# Patient Record
Sex: Female | Born: 1995 | Race: White | Hispanic: No | Marital: Married | Smoking: Never smoker
Health system: Southern US, Community
[De-identification: ages and names within clinical notes are randomized; demographics above are authoritative.]

## PROBLEM LIST (undated history)

## (undated) ENCOUNTER — Inpatient Hospital Stay (HOSPITAL_COMMUNITY): Payer: Self-pay

## (undated) DIAGNOSIS — F419 Anxiety disorder, unspecified: Secondary | ICD-10-CM

## (undated) DIAGNOSIS — Z3A37 37 weeks gestation of pregnancy: Secondary | ICD-10-CM

## (undated) DIAGNOSIS — D649 Anemia, unspecified: Secondary | ICD-10-CM

## (undated) DIAGNOSIS — N390 Urinary tract infection, site not specified: Secondary | ICD-10-CM

## (undated) HISTORY — PX: ANTERIOR CRUCIATE LIGAMENT REPAIR: SHX115

## (undated) HISTORY — DX: 37 weeks gestation of pregnancy: Z3A.37

## (undated) HISTORY — PX: CYSTOSTOMY W/ BLADDER DILATION: SHX1432

## (undated) HISTORY — PX: WISDOM TOOTH EXTRACTION: SHX21

## (undated) HISTORY — DX: Urinary tract infection, site not specified: N39.0

---
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
## 2013-07-13 ENCOUNTER — Telehealth: Payer: Self-pay | Admitting: Nurse Practitioner

## 2013-07-13 NOTE — Telephone Encounter (Signed)
appt given for wed 8/13 with [facility]

## 2013-07-18 ENCOUNTER — Encounter: Payer: Self-pay | Admitting: General Practice

## 2013-07-18 ENCOUNTER — Ambulatory Visit (INDEPENDENT_AMBULATORY_CARE_PROVIDER_SITE_OTHER): Payer: Federal, State, Local not specified - PPO | Admitting: General Practice

## 2013-07-18 VITALS — BP 124/72 | HR 83 | Temp 98.1°F | Ht 68.0 in | Wt 153.0 lb

## 2013-07-18 DIAGNOSIS — Z0000 Encounter for general adult medical examination without abnormal findings: Secondary | ICD-10-CM

## 2013-07-18 NOTE — Progress Notes (Signed)
  Subjective:    Patient ID: Kristen Patterson, female    DOB: [birth year], 17 y.o.   MRN: [id number]  HPI Patient presents today for annnual physical. She reports attending [employer] and participating in volleyball. She denies any concerns or problems at this time. She reports last menstrual cycle was 3 weeks ago.    Review of Systems  Unable to perform ROS Constitutional: Negative for fever and chills.  HENT: Negative for neck pain and neck stiffness.   Respiratory: Negative for chest tightness and shortness of breath.   Cardiovascular: Negative for chest pain and palpitations.  Genitourinary: Negative for dysuria and difficulty urinating.  Neurological: Negative for dizziness, weakness and headaches.       Objective:   Physical Exam  Constitutional: She is oriented to person, place, and time. She appears well-developed and well-nourished.  HENT:  Head: Normocephalic and atraumatic.  Right Ear: External ear normal.  Left Ear: External ear normal.  Nose: Nose normal.  Mouth/Throat: Oropharynx is clear and moist.  Eyes: EOM are normal. Pupils are equal, round, and reactive to light.  Neck: Normal range of motion. Neck supple. No thyromegaly present.  Cardiovascular: Normal rate, regular rhythm and normal heart sounds.   Pulmonary/Chest: Effort normal and breath sounds normal. No respiratory distress. She exhibits no tenderness.  Abdominal: Soft. Bowel sounds are normal. She exhibits no distension. There is no tenderness.  Musculoskeletal: She exhibits no edema and no tenderness.  Lymphadenopathy:    She has no cervical adenopathy.  Neurological: She is alert and oriented to person, place, and time.  Skin: Skin is warm and dry.  Psychiatric: She has a normal mood and affect.          Assessment & Plan:  1. Annual physical exam -discussed eating healthy eating and regular exercise -RTO if symptoms develop  -Patient verbalized understanding -Coralie Keens, FNP-C

## 2014-04-08 IMAGING — US US GUIDANCE NEEDLE PLACEMENT
1 series · 8 of 8 positions shown · non-contrast
Comparison: None.

CLINICAL DATA: Fluid collection around scapula.

IR ULTRASOUND GUIDED ASPIRATION/DRAINAGE

[Series 1: us guidance needle placement · 0.13mm/px · 8 of 8 slices shown]
[im 1/8]
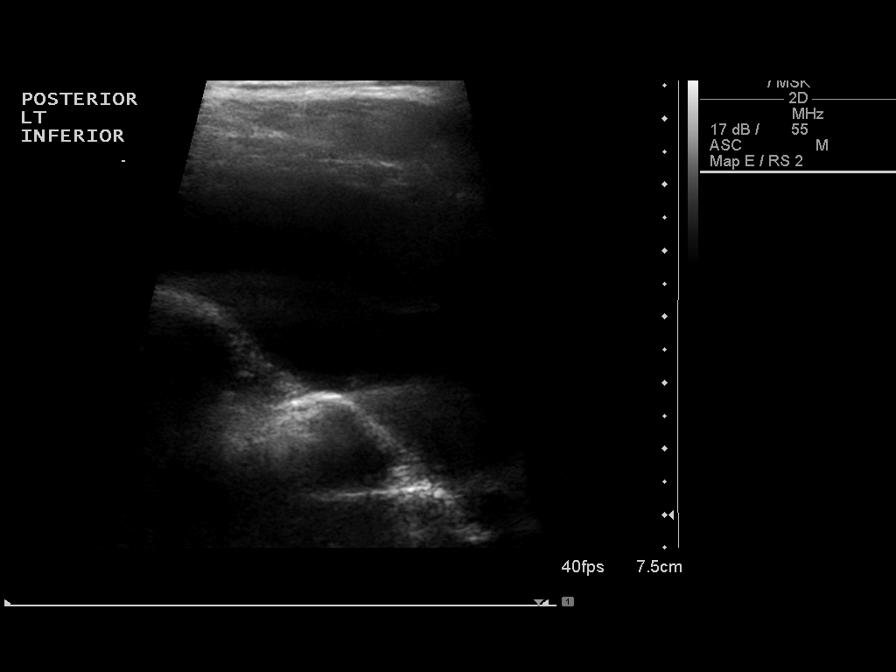
[im 2/8]
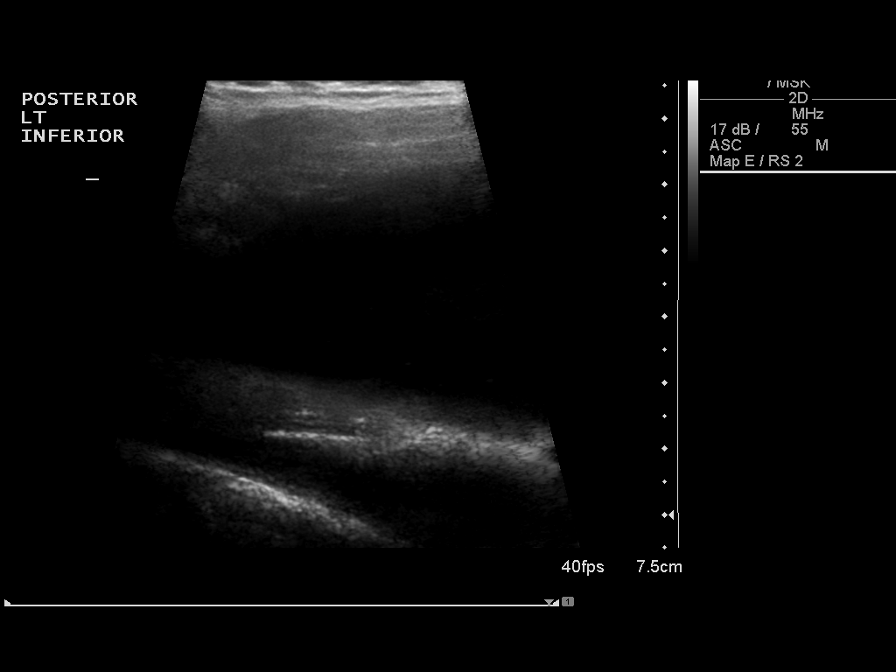
[im 3/8]
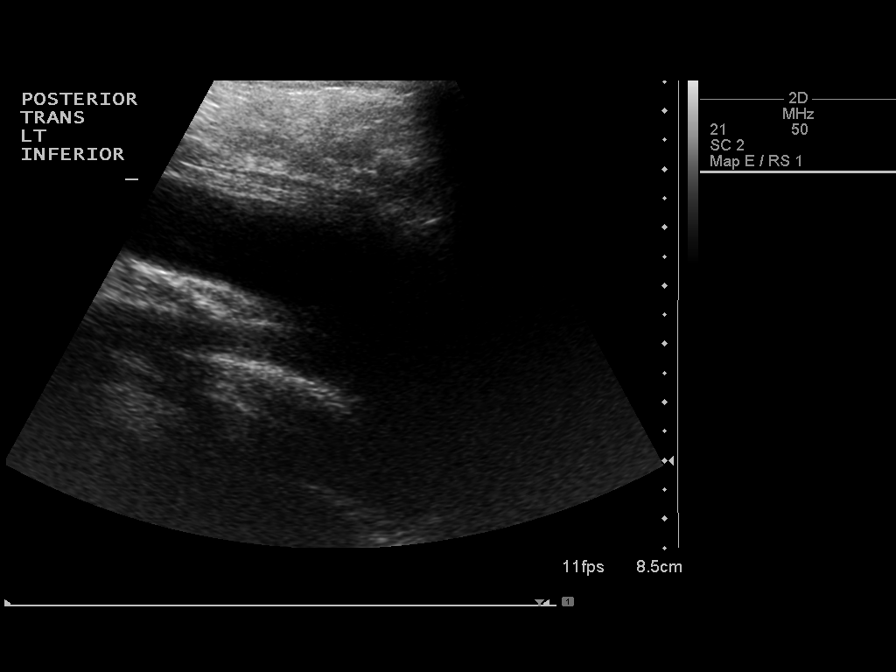
[im 4/8]
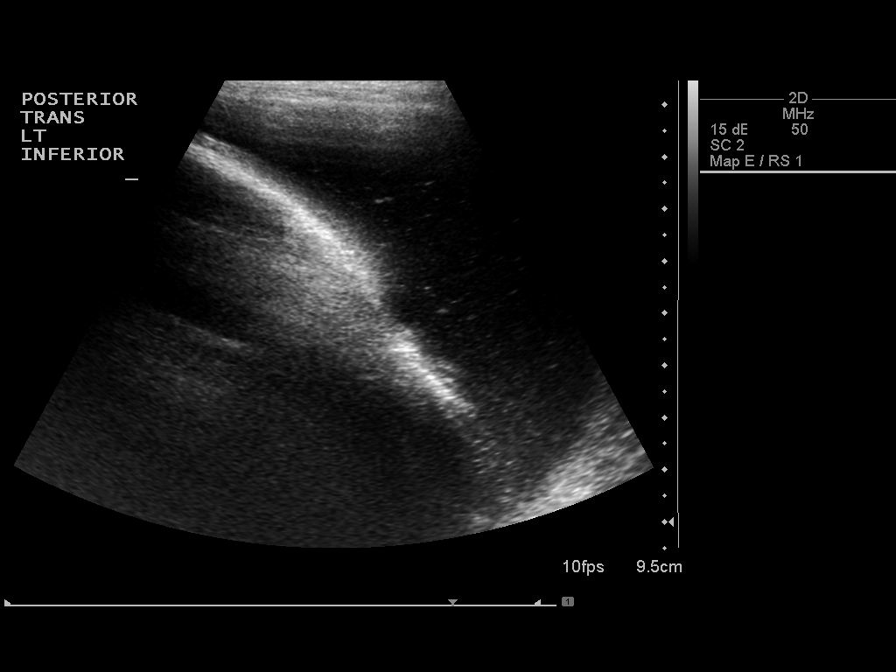
[im 5/8]
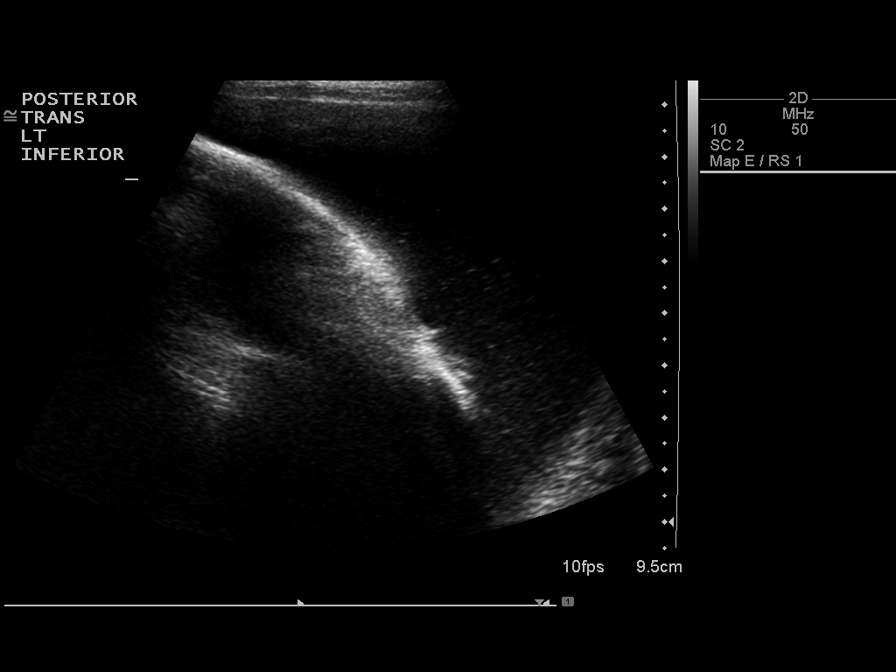
[im 6/8]
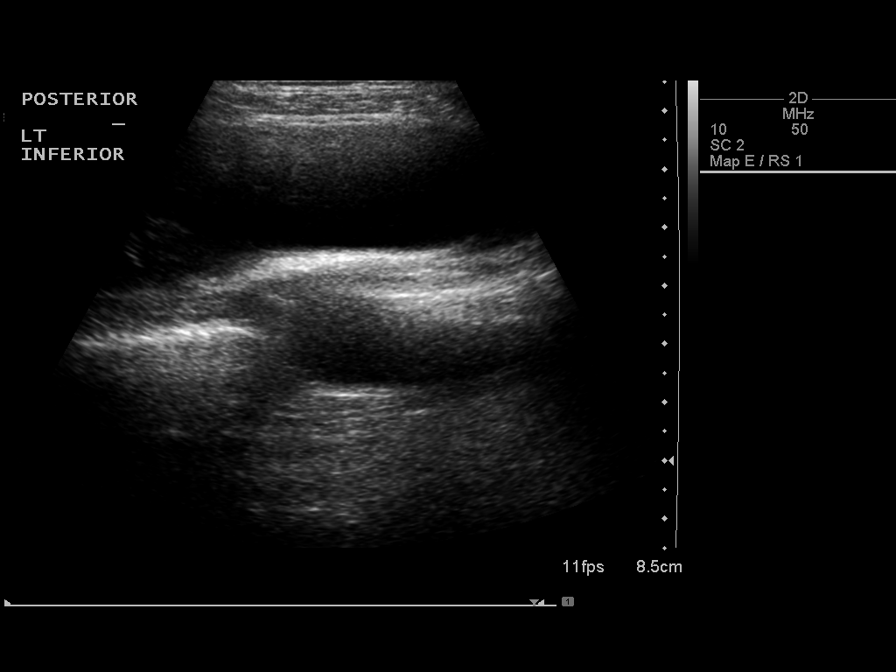
[im 7/8]
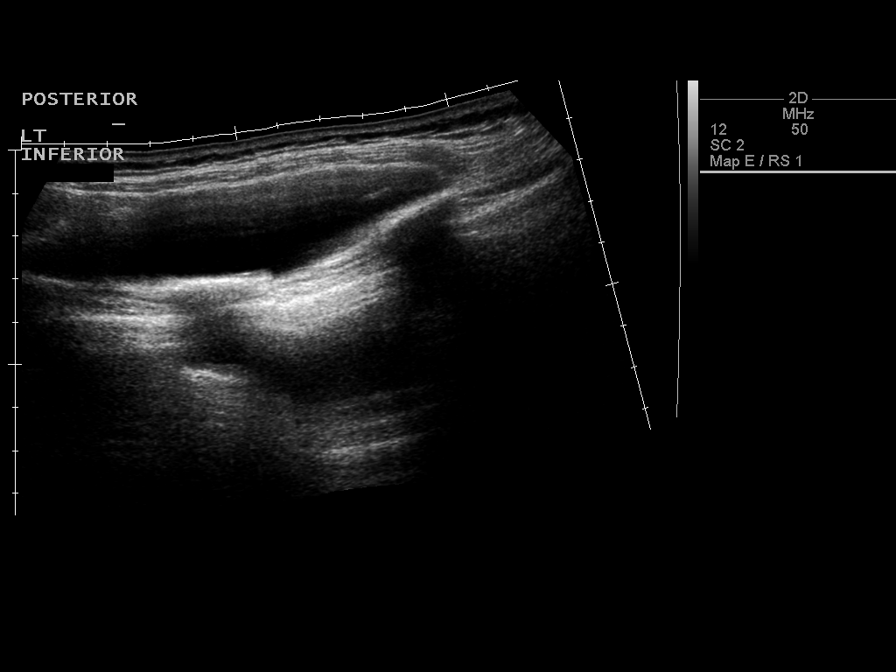
[im 8/8]
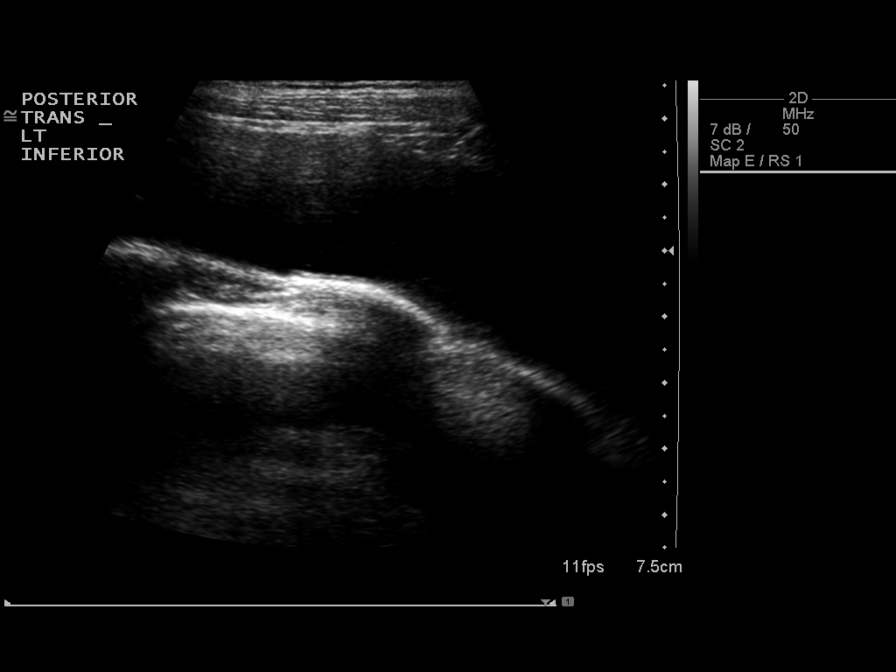

[8 of 8 positions shown; findings below may reference images not displayed]

FINDINGS: Preliminary ultrasound scanning demonstrated a fluid
collection interposed between the superficial back musculature and
the thoracic cage in the left upper back.  This had internal echoes
with septations and was consistent with liquefied hematoma.  No
internal vascular flow is identified.

After thorough discussion of risks and benefits of the procedure,
written and verbal informed consent was obtained by Dr. Xian.
General risks of the procedure included bleeding, infection, injury
to nerves, blood vessels, and adjacent structures.  Specific risks
included pneumothorax.

The skin was prepped and draped in the usual sterile fashion.
Under stringent sterile technique, a 20 gauge needle was advanced
under ultrasound guidance after local anesthesia with 1% lidocaine
without epinephrine.  Using ultrasound guidance, the fluid pockets
were anterior and 40 ml of bloody serous fluid was aspirated.  The
pocket almost completely collapsed.  There was rupture of the more
lateral superior pocket on entry.

The patient tolerated the procedure well.  There are no
complications. Postprocedural scanning demonstrate collapse of the
pockets.
IMPRESSION: Successful ultrasound guided aspiration of left upper back
liquefied hematoma.

## 2014-12-04 LAB — OB RESULTS CONSOLE ABO/RH: RH Type: POSITIVE

## 2014-12-04 LAB — OB RESULTS CONSOLE RUBELLA ANTIBODY, IGM: Rubella: IMMUNE

## 2014-12-04 LAB — OB RESULTS CONSOLE GC/CHLAMYDIA
Chlamydia: NEGATIVE
Gonorrhea: NEGATIVE

## 2014-12-04 LAB — OB RESULTS CONSOLE ANTIBODY SCREEN: Antibody Screen: NEGATIVE

## 2014-12-04 LAB — OB RESULTS CONSOLE HEPATITIS B SURFACE ANTIGEN: HEP B S AG: NEGATIVE

## 2014-12-04 LAB — OB RESULTS CONSOLE HIV ANTIBODY (ROUTINE TESTING): HIV: NONREACTIVE

## 2014-12-04 LAB — OB RESULTS CONSOLE RPR: RPR: NONREACTIVE

## 2014-12-06 NOTE — L&D Delivery Note (Addendum)
Delivery Note   Kristen Patterson, Kristen Patterson [[id number]][[id number]]  At 11:28 AM a viable female was delivered via Vaginal, Vacuum (Extractor) (Presentation: Left Occiput Anterior).  APGAR: 4, 4; weight  Pending.  Outlet vacuum with Kiwi due to severe maternal back pain.   Placenta status: Intact, Spontaneous Pathology.  Cord: 3 vessels with the following complications: None.     Kristen Patterson, Kristen Patterson [[id number]][[id number]]  At 12:12 PM a viable female was delivered via  (Presentation: ;  ).  APGAR: 1, 8; weight  pending.   Placenta status: spontaneous, intact.  Cord: 3 vessels with the following complications: none.  Baby B was breech, I attempted external cephalic version without success.  By u/s and palpation the baby was in frank breech presentation so I could not do a breech extraction.  I did AROM with clear fluid, breech came down nicely and she pushed the baby out without difficulty with routine maneuvers for a breech delivery.  Anesthesia: Epidural and local Episiotomy:  None Lacerations:  2nd degree and left labial Suture Repair: 2.0 vicryl and 3-0 Vicryl Rapide Est. Blood Loss (mL): 500   Mom to postpartum.   Baby A to NICU.   Baby B to Couplet care / Skin to Skin.  Julian Medina D 06/14/2015, 12:55 PM

## 2015-06-10 ENCOUNTER — Telehealth (HOSPITAL_COMMUNITY): Payer: Self-pay | Admitting: *Deleted

## 2015-06-10 ENCOUNTER — Encounter (HOSPITAL_COMMUNITY): Payer: Self-pay | Admitting: *Deleted

## 2015-06-10 LAB — OB RESULTS CONSOLE GBS: STREP GROUP B AG: POSITIVE

## 2015-06-10 NOTE — Telephone Encounter (Signed)
Preadmission screen  

## 2015-06-13 ENCOUNTER — Inpatient Hospital Stay (HOSPITAL_COMMUNITY)
Admission: RE | Admit: 2015-06-13 | Discharge: 2015-06-16 | DRG: 775 | Disposition: A | Discharge status: Home / Self Care | Payer: Federal, State, Local not specified - PPO | Source: Ambulatory Visit | Attending: Obstetrics and Gynecology | Admitting: Obstetrics and Gynecology

## 2015-06-13 ENCOUNTER — Inpatient Hospital Stay (HOSPITAL_COMMUNITY): Payer: Federal, State, Local not specified - PPO | Admitting: Anesthesiology

## 2015-06-13 ENCOUNTER — Encounter (HOSPITAL_COMMUNITY): Payer: Self-pay

## 2015-06-13 ENCOUNTER — Inpatient Hospital Stay (HOSPITAL_COMMUNITY): Payer: Federal, State, Local not specified - PPO

## 2015-06-13 DIAGNOSIS — O30003 Twin pregnancy, unspecified number of placenta and unspecified number of amniotic sacs, third trimester: Secondary | ICD-10-CM | POA: Diagnosis present

## 2015-06-13 DIAGNOSIS — O99824 Streptococcus B carrier state complicating childbirth: Secondary | ICD-10-CM | POA: Diagnosis present

## 2015-06-13 DIAGNOSIS — O30043 Twin pregnancy, dichorionic/diamniotic, third trimester: Secondary | ICD-10-CM | POA: Diagnosis present

## 2015-06-13 DIAGNOSIS — Z3A37 37 weeks gestation of pregnancy: Secondary | ICD-10-CM | POA: Diagnosis present

## 2015-06-13 DIAGNOSIS — O133 Gestational [pregnancy-induced] hypertension without significant proteinuria, third trimester: Secondary | ICD-10-CM | POA: Diagnosis present

## 2015-06-13 DIAGNOSIS — Z3689 Encounter for other specified antenatal screening: Secondary | ICD-10-CM | POA: Diagnosis not present

## 2015-06-13 DIAGNOSIS — O321XX2 Maternal care for breech presentation, fetus 2: Secondary | ICD-10-CM | POA: Diagnosis present

## 2015-06-13 DIAGNOSIS — O30009 Twin pregnancy, unspecified number of placenta and unspecified number of amniotic sacs, unspecified trimester: Secondary | ICD-10-CM | POA: Diagnosis not present

## 2015-06-13 HISTORY — DX: Anemia, unspecified: D64.9

## 2015-06-13 LAB — CBC
HCT: 33.7 % — ABNORMAL LOW (ref 36.0–46.0)
HEMOGLOBIN: 11.9 g/dL — AB (ref 12.0–15.0)
MCH: 33.7 pg (ref 26.0–34.0)
MCHC: 35.3 g/dL (ref 30.0–36.0)
MCV: 95.5 fL (ref 78.0–100.0)
Platelets: 149 10*3/uL — ABNORMAL LOW (ref 150–400)
RBC: 3.53 MIL/uL — ABNORMAL LOW (ref 3.87–5.11)
RDW: 13.3 % (ref 11.5–15.5)
WBC: 8 10*3/uL (ref 4.0–10.5)

## 2015-06-13 LAB — COMPREHENSIVE METABOLIC PANEL
ALT: 11 U/L — ABNORMAL LOW (ref 14–54)
ANION GAP: 6 (ref 5–15)
AST: 21 U/L (ref 15–41)
Albumin: 2.7 g/dL — ABNORMAL LOW (ref 3.5–5.0)
Alkaline Phosphatase: 231 U/L — ABNORMAL HIGH (ref 38–126)
BUN: 7 mg/dL (ref 6–20)
CHLORIDE: 108 mmol/L (ref 101–111)
CO2: 18 mmol/L — ABNORMAL LOW (ref 22–32)
CREATININE: 0.49 mg/dL (ref 0.44–1.00)
Calcium: 8.4 mg/dL — ABNORMAL LOW (ref 8.9–10.3)
GFR calc Af Amer: >60 mL/min (ref >60)
GFR calc non Af Amer: >60 mL/min (ref >60)
GLUCOSE: 83 mg/dL (ref 65–99)
Potassium: 3.9 mmol/L (ref 3.5–5.1)
Sodium: 132 mmol/L — ABNORMAL LOW (ref 135–145)
Total Bilirubin: 0.6 mg/dL (ref 0.3–1.2)
Total Protein: 5.6 g/dL — ABNORMAL LOW (ref 6.5–8.1)

## 2015-06-13 LAB — RPR: RPR Ser Ql: NONREACTIVE

## 2015-06-13 MED ORDER — DIPHENHYDRAMINE HCL 50 MG/ML IJ SOLN: 12.5000 mg | INTRAMUSCULAR | Status: DC | PRN

## 2015-06-13 MED ORDER — BUTORPHANOL TARTRATE 1 MG/ML IJ SOLN: 1.0000 mg | INTRAMUSCULAR | Status: DC | PRN

## 2015-06-13 MED ORDER — LIDOCAINE HCL (PF) 1 % IJ SOLN
INTRAMUSCULAR | Status: DC | PRN
  Administered 2015-06-13 (×2): 9 mL

## 2015-06-13 MED ORDER — CITRIC ACID-SODIUM CITRATE 334-500 MG/5ML PO SOLN
30.0000 mL | ORAL | Status: DC | PRN
  Administered 2015-06-13 – 2015-06-14 (×2): 30 mL via ORAL
  Filled 2015-06-13 (×2): qty 15

## 2015-06-13 MED ORDER — PHENYLEPHRINE 40 MCG/ML (10ML) SYRINGE FOR IV PUSH (FOR BLOOD PRESSURE SUPPORT)
80.0000 ug | PREFILLED_SYRINGE | INTRAVENOUS | Status: DC | PRN
  Filled 2015-06-13 (×2): qty 20
  Filled 2015-06-13: qty 2

## 2015-06-13 MED ORDER — OXYCODONE-ACETAMINOPHEN 5-325 MG PO TABS: 2.0000 | ORAL_TABLET | ORAL | Status: DC | PRN

## 2015-06-13 MED ORDER — OXYCODONE-ACETAMINOPHEN 5-325 MG PO TABS: 1.0000 | ORAL_TABLET | ORAL | Status: DC | PRN

## 2015-06-13 MED ORDER — OXYTOCIN 40 UNITS IN LACTATED RINGERS INFUSION - SIMPLE MED
1.0000 m[IU]/min | INTRAVENOUS | Status: DC
  Administered 2015-06-13: 2 m[IU]/min via INTRAVENOUS
  Administered 2015-06-13: 4 m[IU]/min via INTRAVENOUS
  Filled 2015-06-13: qty 1000

## 2015-06-13 MED ORDER — OXYTOCIN 40 UNITS IN LACTATED RINGERS INFUSION - SIMPLE MED: 62.5000 mL/h | INTRAVENOUS | Status: DC

## 2015-06-13 MED ORDER — LACTATED RINGERS IV SOLN
INTRAVENOUS | Status: DC
  Administered 2015-06-13 – 2015-06-14 (×4): via INTRAVENOUS

## 2015-06-13 MED ORDER — OXYTOCIN BOLUS FROM INFUSION
500.0000 mL | INTRAVENOUS | Status: DC
  Administered 2015-06-14: 500 mL via INTRAVENOUS

## 2015-06-13 MED ORDER — TERBUTALINE SULFATE 1 MG/ML IJ SOLN: 0.2500 mg | Freq: Once | INTRAMUSCULAR | Status: AC | PRN

## 2015-06-13 MED ORDER — DEXTROSE 5 % IV SOLN
2.5000 10*6.[IU] | INTRAVENOUS | Status: DC
  Administered 2015-06-13 – 2015-06-14 (×6): 2.5 10*6.[IU] via INTRAVENOUS
  Filled 2015-06-13 (×11): qty 2.5

## 2015-06-13 MED ORDER — FENTANYL 2.5 MCG/ML BUPIVACAINE 1/10 % EPIDURAL INFUSION (WH - ANES)
14.0000 mL/h | INTRAMUSCULAR | Status: DC | PRN
  Administered 2015-06-13 – 2015-06-14 (×4): 14 mL/h via EPIDURAL
  Filled 2015-06-13 (×5): qty 125

## 2015-06-13 MED ORDER — LIDOCAINE HCL (PF) 1 % IJ SOLN
30.0000 mL | INTRAMUSCULAR | Status: DC | PRN
  Filled 2015-06-13: qty 30

## 2015-06-13 MED ORDER — PENICILLIN G POTASSIUM 5000000 UNITS IJ SOLR
5.0000 10*6.[IU] | Freq: Once | INTRAVENOUS | Status: AC
  Administered 2015-06-13: 5 10*6.[IU] via INTRAVENOUS
  Filled 2015-06-13: qty 5

## 2015-06-13 MED ORDER — EPHEDRINE 5 MG/ML INJ: 10.0000 mg | INTRAVENOUS | Status: DC | PRN

## 2015-06-13 MED ORDER — ONDANSETRON HCL 4 MG/2ML IJ SOLN: 4.0000 mg | Freq: Four times a day (QID) | INTRAMUSCULAR | Status: DC | PRN

## 2015-06-13 MED ORDER — LACTATED RINGERS IV SOLN
500.0000 mL | INTRAVENOUS | Status: DC | PRN
  Administered 2015-06-13: 1000 mL via INTRAVENOUS

## 2015-06-13 NOTE — Progress Notes (Signed)
Comfortable, some back pain Afeb, VSS, BP stable FHT- Cat I x 2, ctx q 2-3 min VE-4/90/-1, vtx Continue pitocin and PCN, monitor progress, anticipate SVD

## 2015-06-13 NOTE — Progress Notes (Signed)
Comfortable with epidural Afeb, VSS, BP 140-150/70-90 FHT- Cat I x 2 VE-3/80/-1, vtx Continue pitocin, monitor progress.  Will dleiver in OR since Baby B is breech, will try version or breech extraction.

## 2015-06-13 NOTE — Anesthesia Preprocedure Evaluation (Addendum)
Anesthesia Evaluation  Patient identified by MRN, date of birth, ID band Patient awake    Reviewed: Allergy & Precautions, H&P , NPO status , Patient's Chart, lab work & pertinent test results  Airway Mallampati: II  TM Distance: >3 FB Neck ROM: full    Dental no notable dental hx.    Pulmonary neg pulmonary ROS,    Pulmonary exam normal       Cardiovascular negative cardio ROS Normal cardiovascular exam    Neuro/Psych negative neurological ROS  negative psych ROS   GI/Hepatic negative GI ROS, Neg liver ROS,   Endo/Other  negative endocrine ROS  Renal/GU negative Renal ROS     Musculoskeletal   Abdominal (+) + obese,   Peds  Hematology negative hematology ROS (+)   Anesthesia Other Findings   Reproductive/Obstetrics (+) Pregnancy                             Anesthesia Physical Anesthesia Plan  ASA: II  Anesthesia Plan: Epidural   Post-op Pain Management:    Induction:   Airway Management Planned:   Additional Equipment:   Intra-op Plan:   Post-operative Plan:   Informed Consent: I have reviewed the patients History and Physical, chart, labs and discussed the procedure including the risks, benefits and alternatives for the proposed anesthesia with the patient or authorized representative who has indicated his/her understanding and acceptance.     Plan Discussed with:   Anesthesia Plan Comments: (To OR for vaginal delivery of twins with existing epidural.)       Anesthesia Quick Evaluation

## 2015-06-13 NOTE — H&P (Signed)
Kristen Patterson is a 19 y.o. female, G1P0, EGA 37+ weeks with EDC 7-28, di/di twins presenting for induction with favorable cervix and PIH.  Prenatal care complicated by di/di twins, most recently vtx/vtx, with concordant growth, and mild hypertension and borderline proteinuria, BP controlled with low dose Labetalol.  Maternal Medical History:  Fetal activity: Perceived fetal activity is normal.    Prenatal complications: PIH.     OB History    Gravida Para Term Preterm AB TAB SAB Ectopic Multiple Living   1              Past Medical History  Diagnosis Date  . Chronic UTI     as child   Past Surgical History  Procedure Laterality Date  . Anterior cruciate ligament repair    . Wisdom tooth extraction    . Cystostomy w/ bladder dilation     Family History: family history is not on file. Social History:  reports that she has never smoked. She has never used smokeless tobacco. She reports that she does not drink alcohol or use illicit drugs.   Prenatal Transfer Tool  Maternal Diabetes: No Genetic Screening: Declined Maternal Ultrasounds/Referrals: Normal Fetal Ultrasounds or other Referrals:  None Maternal Substance Abuse:  No Significant Maternal Medications:  None Significant Maternal Lab Results:  Lab values include: Group B Strep positive Other Comments:  di/di twins, PIH  Review of Systems  Respiratory: Negative.   Cardiovascular: Negative.       Last menstrual period 09/26/2014. Maternal Exam:  Abdomen: Patient reports no abdominal tenderness. Fetal presentation: vertex  Introitus: Normal vulva. Normal vagina.  Amniotic fluid character: not assessed.  Pelvis: adequate for delivery.   Cervix: Cervix evaluated by digital exam.    VE-2-3/70/-2, vtx Physical Exam  Constitutional: She appears well-developed and well-nourished.  Cardiovascular: Normal rate, regular rhythm and normal heart sounds.   No murmur heard. Respiratory: Effort normal and breath sounds  normal. No respiratory distress. She has no wheezes.  GI: Soft.    Prenatal labs: ABO, Rh: A/Positive/-- (12/30 0000) Antibody: Negative (12/30 0000) Rubella: Immune (12/30 0000) RPR: Nonreactive (12/30 0000)  HBsAg: Negative (12/30 0000)  HIV: Non-reactive (12/30 0000)  GBS: Positive (07/05 0000)   Assessment/Plan: Di/di twin pregnancy at 37 weeks with PIH and favorable cervix for induction.  Will start pitocin for induction, PCN for +GBS.  Will get u/s to confirm presentation of Baby B just to help direct delivery.   Kristen Patterson D 06/13/2015, 8:20 AM

## 2015-06-13 NOTE — Anesthesia Procedure Notes (Signed)
Epidural Patient location during procedure: OB Start time: 06/13/2015 1:04 PM End time: 06/13/2015 1:08 PM  Staffing Anesthesiologist: Leilani AbleHATCHETT, Donielle Radziewicz Performed by: anesthesiologist   Preanesthetic Checklist Completed: patient identified, surgical consent, pre-op evaluation, timeout performed, IV checked, risks and benefits discussed and monitors and equipment checked  Epidural Patient position: sitting Prep: site prepped and draped and DuraPrep Patient monitoring: continuous pulse ox and blood pressure Approach: midline Location: L3-L4 Injection technique: LOR air  Needle:  Needle type: Tuohy  Needle gauge: 17 G Needle length: 9 cm and 9 Needle insertion depth: 5 cm cm Catheter type: closed end flexible Catheter size: 19 Gauge Catheter at skin depth: 10 cm Test dose: negative and Other  Assessment Sensory level: T9 Events: blood not aspirated, injection not painful, no injection resistance, negative IV test and no paresthesia  Additional Notes Reason for block:procedure for pain

## 2015-06-14 ENCOUNTER — Encounter (HOSPITAL_COMMUNITY)
Admission: RE | Disposition: A | Discharge status: Home / Self Care | Payer: Self-pay | Source: Ambulatory Visit | Attending: Obstetrics and Gynecology

## 2015-06-14 ENCOUNTER — Encounter (HOSPITAL_COMMUNITY): Payer: Self-pay

## 2015-06-14 DIAGNOSIS — O30009 Twin pregnancy, unspecified number of placenta and unspecified number of amniotic sacs, unspecified trimester: Secondary | ICD-10-CM

## 2015-06-14 HISTORY — DX: Twin pregnancy, unspecified number of placenta and unspecified number of amniotic sacs, unspecified trimester: O30.009

## 2015-06-14 LAB — CBC
HEMATOCRIT: 34.4 % — AB (ref 36.0–46.0)
Hemoglobin: 12.1 g/dL (ref 12.0–15.0)
MCH: 33.6 pg (ref 26.0–34.0)
MCHC: 35.2 g/dL (ref 30.0–36.0)
MCV: 95.6 fL (ref 78.0–100.0)
Platelets: 117 10*3/uL — ABNORMAL LOW (ref 150–400)
RBC: 3.6 MIL/uL — ABNORMAL LOW (ref 3.87–5.11)
RDW: 13.2 % (ref 11.5–15.5)
WBC: 16.6 10*3/uL — ABNORMAL HIGH (ref 4.0–10.5)

## 2015-06-14 LAB — TYPE AND SCREEN
ABO/RH(D): A POS
ANTIBODY SCREEN: NEGATIVE

## 2015-06-14 LAB — ABO/RH: ABO/RH(D): A POS

## 2015-06-14 SURGERY — Surgical Case
Anesthesia: Epidural

## 2015-06-14 MED ORDER — DIPHENHYDRAMINE HCL 25 MG PO CAPS: 25.0000 mg | ORAL_CAPSULE | Freq: Four times a day (QID) | ORAL | Status: DC | PRN

## 2015-06-14 MED ORDER — LIDOCAINE-EPINEPHRINE (PF) 2 %-1:200000 IJ SOLN
INTRAMUSCULAR | Status: DC | PRN
  Administered 2015-06-14: 4 mL via EPIDURAL
  Administered 2015-06-14: 1 mL via EPIDURAL
  Administered 2015-06-14: 2 mL via EPIDURAL
  Administered 2015-06-14: 1 mL via EPIDURAL
  Administered 2015-06-14: 3 mL via EPIDURAL
  Administered 2015-06-14: 5 mL via EPIDURAL
  Administered 2015-06-14: 4 mL via EPIDURAL

## 2015-06-14 MED ORDER — OXYCODONE-ACETAMINOPHEN 5-325 MG PO TABS
1.0000 | ORAL_TABLET | ORAL | Status: DC | PRN
  Administered 2015-06-15: 1 via ORAL
  Filled 2015-06-14 (×2): qty 1

## 2015-06-14 MED ORDER — ONDANSETRON HCL 4 MG/2ML IJ SOLN: 4.0000 mg | INTRAMUSCULAR | Status: DC | PRN

## 2015-06-14 MED ORDER — METHYLERGONOVINE MALEATE 0.2 MG/ML IJ SOLN: 0.2000 mg | INTRAMUSCULAR | Status: DC | PRN

## 2015-06-14 MED ORDER — MEASLES, MUMPS & RUBELLA VAC ~~LOC~~ INJ
0.5000 mL | INJECTION | Freq: Once | SUBCUTANEOUS | Status: DC
  Filled 2015-06-14: qty 0.5

## 2015-06-14 MED ORDER — SIMETHICONE 80 MG PO CHEW: 80.0000 mg | CHEWABLE_TABLET | ORAL | Status: DC | PRN

## 2015-06-14 MED ORDER — PRENATAL MULTIVITAMIN CH
1.0000 | ORAL_TABLET | Freq: Every day | ORAL | Status: DC
  Administered 2015-06-15 – 2015-06-16 (×2): 1 via ORAL
  Filled 2015-06-14 (×2): qty 1

## 2015-06-14 MED ORDER — MAGNESIUM HYDROXIDE 400 MG/5ML PO SUSP: 30.0000 mL | ORAL | Status: DC | PRN

## 2015-06-14 MED ORDER — METHYLERGONOVINE MALEATE 0.2 MG PO TABS: 0.2000 mg | ORAL_TABLET | ORAL | Status: DC | PRN

## 2015-06-14 MED ORDER — ACETAMINOPHEN 325 MG PO TABS
650.0000 mg | ORAL_TABLET | ORAL | Status: DC | PRN
  Administered 2015-06-14: 650 mg via ORAL
  Filled 2015-06-14: qty 2

## 2015-06-14 MED ORDER — DIBUCAINE 1 % RE OINT: 1.0000 "application " | TOPICAL_OINTMENT | RECTAL | Status: DC | PRN

## 2015-06-14 MED ORDER — WITCH HAZEL-GLYCERIN EX PADS: 1.0000 "application " | MEDICATED_PAD | CUTANEOUS | Status: DC | PRN

## 2015-06-14 MED ORDER — SENNOSIDES-DOCUSATE SODIUM 8.6-50 MG PO TABS
2.0000 | ORAL_TABLET | ORAL | Status: DC
  Administered 2015-06-14 – 2015-06-15 (×2): 2 via ORAL
  Filled 2015-06-14 (×2): qty 2

## 2015-06-14 MED ORDER — ONDANSETRON HCL 4 MG PO TABS: 4.0000 mg | ORAL_TABLET | ORAL | Status: DC | PRN

## 2015-06-14 MED ORDER — BENZOCAINE-MENTHOL 20-0.5 % EX AERO: 1.0000 "application " | INHALATION_SPRAY | CUTANEOUS | Status: DC | PRN

## 2015-06-14 MED ORDER — ZOLPIDEM TARTRATE 5 MG PO TABS: 5.0000 mg | ORAL_TABLET | Freq: Every evening | ORAL | Status: DC | PRN

## 2015-06-14 MED ORDER — LANOLIN HYDROUS EX OINT: TOPICAL_OINTMENT | CUTANEOUS | Status: DC | PRN

## 2015-06-14 MED ORDER — TETANUS-DIPHTH-ACELL PERTUSSIS 5-2.5-18.5 LF-MCG/0.5 IM SUSP: 0.5000 mL | Freq: Once | INTRAMUSCULAR | Status: DC

## 2015-06-14 MED ORDER — IBUPROFEN 600 MG PO TABS
600.0000 mg | ORAL_TABLET | Freq: Four times a day (QID) | ORAL | Status: DC
  Administered 2015-06-14 – 2015-06-16 (×7): 600 mg via ORAL
  Filled 2015-06-14 (×8): qty 1

## 2015-06-14 NOTE — Addendum Note (Signed)
Addendum  created 06/14/15 1700 by Renford DillsJanet L Jameelah Watts, CRNA   Modules edited: Notes Section   Notes Section:  File: [id number]

## 2015-06-14 NOTE — Lactation Note (Signed)
This note was copied from the chart of GirlB Verlyn Harbaugh. Lactation Consultation Note  Patient Name: GirlB Wylma Kotula Today's Date: 06/14/2015 Reason for consult: Initial assessment;Infant < 6lbs;Multiple gestation;Other (Comment) (early term baby 37 2/[redacted] weeks gestation) with this mom with baby B, now 3 hours old, 37 2/[redacted] weeks gestation, and weighs  5 lbs 12.4 ounces. Mom was exhausted, but agreed to allow me to assist her with latching baby B.I positioned pillows for mom to hold baby for cross cradle hold, and with second attempt, she latched deeply, with strong suckles and visible swallows. I did a brief hand expression prior to latch, and mom has lots of easily expressed colostrum. I did not go into teaching at this time, but told mom to try and rest after feeding the baby, and then call when ready for teaching. I also put a DEP in the room, and NICU booklet on providing EBm for the baby, and asked that mom call when ready to begin pumping. Mom thanked me for my assistance and was receptive to my teaching.    Maternal Data Formula Feeding for Exclusion: No Has patient been taught Hand Expression?: Yes Does the patient have breastfeeding experience prior to this delivery?: No  Feeding Feeding Type: Breast Fed Length of feed:  (at least 10 minutes was still breast feeding when I left room)  LATCH Score/Interventions Latch: Repeated attempts needed to sustain latch, nipple held in mouth throughout feeding, stimulation needed to elicit sucking reflex. Intervention(s): Adjust position;Assist with latch;Breast compression  Audible Swallowing: Spontaneous and intermittent (visible swallows, lots of easily expressed colostrum)  Type of Nipple: Everted at rest and after stimulation  Comfort (Breast/Nipple): Soft / non-tender     Hold (Positioning): Assistance needed to correctly position infant at breast and maintain latch. Intervention(s): Support Pillows;Skin to skin  LATCH Score:  8  Lactation Tools Discussed/Used     Consult Status Consult Status: Follow-up Date: 06/14/15 Follow-up type: In-patient    Pernell Dikes Anne 06/14/2015, 3:57 PM    

## 2015-06-14 NOTE — Progress Notes (Signed)
She has made slow progress overnight Still having some back pain Afeb, VSS, BP 130-150/60-90 FHT- Cat I-II, occ variable decel VE-C/C/+1, vtx, small lip reduced with push Will start pushing and monitor progress, continue PCN

## 2015-06-14 NOTE — Anesthesia Postprocedure Evaluation (Signed)
  Anesthesia Post-op Note  Patient: Kristen Patterson  Procedure(s) Performed: Procedure(s): MULTI-GESTATIONAL VAGINAL DELIVERY, POSSIBLE CESAREAN SECTION (N/A)  Patient Location: Mother/Baby  Anesthesia Type:Epidural  Level of Consciousness: awake  Airway and Oxygen Therapy: Patient Spontanous Breathing  Post-op Pain: mild  Post-op Assessment: Patient's Cardiovascular Status Stable and Respiratory Function Stable              Post-op Vital Signs: stable  Last Vitals:  Filed Vitals:   06/14/15 1401  BP: 149/92  Pulse: 113  Temp:   Resp:     Complications: No apparent anesthesia complications

## 2015-06-14 NOTE — Transfer of Care (Addendum)
Immediate Anesthesia Transfer of Care Note  Patient: Kristen Patterson  Procedure(s) Performed: Procedure(s): MULTI-GESTATIONAL VAGINAL DELIVERY, POSSIBLE CESAREAN SECTION (N/A)  Patient Location: Labor and Delivery  Anesthesia Type:Epidural  Level of Consciousness: Awake  Airway & Oxygen Therapy: Natural  Post-op Assessment: Stable, recovering from epidural  Post vital signs: VSSAF  Last Vitals:  Filed Vitals:   06/14/15 1401  BP: 149/92  Pulse: 113  Temp:   Resp:     Complications: No apparent anesthetic complications

## 2015-06-14 NOTE — Lactation Note (Signed)
This note was copied from the chart of Kristen Patterson. Lactation Consultation Note Follow up visit at 9 hours of age.  Visit requested by MBU RN to assist with pump set up.  Nursery nurse at bedside assisting with feeding baby not staying latched.  Lab drew CBG, 58 at this time.  Baby had been STS without a feeding.  Worked with mom on hand expression and collected 2 1/2 colostrum containers.  Encouraged mom to bring one to NICU for twin.  Assisted with spoon feeding baby about 2.5mls.  Baby is sleepy and  Not active for feeding.  Mom plans to offer breast and then supplement with colostrum 5-7 mls if tolerated by baby and then to post pump on preemie setting.  Mom is to also work on hand expression after pumping to collect for both babies.  Mom encouraged to wake baby every 3 hours for feedings.  Baby will continue to have CBG's monitored.  Discussed normal new born behavior and limiting feeding sessions to 30 min to not exhaust baby.  Mom is using a #30 flange on left breast nipple is larger and looks slightly bifurcated.  Mom is using #27 on right breast with slight discomfort and pink around base of nipple from previous latch.  Mom to express colostrum and rub into nipples.  Mom to call for assist as needed.   Patient Name: Kristen Patterson Today's Date: 06/14/2015 Reason for consult: Follow-up assessment   Maternal Data    Feeding Feeding Type: Breast Fed  LATCH Score/Interventions Latch: Repeated attempts needed to sustain latch, nipple held in mouth throughout feeding, stimulation needed to elicit sucking reflex. Intervention(s): Adjust position;Assist with latch;Breast compression  Audible Swallowing: None Intervention(s): Skin to skin  Type of Nipple: Everted at rest and after stimulation  Comfort (Breast/Nipple): Soft / non-tender     Hold (Positioning): Assistance needed to correctly position infant at breast and maintain latch. Intervention(s): Breastfeeding basics  reviewed;Support Pillows;Skin to skin  LATCH Score: 6  Lactation Tools Discussed/Used     Consult Status      Shoptaw, Jana Lynn 06/14/2015, 9:25 PM    

## 2015-06-14 NOTE — Anesthesia Postprocedure Evaluation (Signed)
Anesthesia Post Note  Patient: Kristen Patterson  Procedure(s) Performed: Procedure(s) (LRB): MULTI-GESTATIONAL VAGINAL DELIVERY, POSSIBLE CESAREAN SECTION (N/A)  Anesthesia type: Epidural  Patient location: Mother/Baby  Post pain: Pain level controlled  Post assessment: Post-op Vital signs reviewed  Last Vitals:  Filed Vitals:   06/14/15 1401  BP: 149/92  Pulse: 113  Temp:   Resp:     Post vital signs: Reviewed  Level of consciousness: awake  Complications: No apparent anesthesia complications

## 2015-06-15 LAB — CBC
HEMATOCRIT: 32.1 % — AB (ref 36.0–46.0)
Hemoglobin: 11.2 g/dL — ABNORMAL LOW (ref 12.0–15.0)
MCH: 33.3 pg (ref 26.0–34.0)
MCHC: 34.9 g/dL (ref 30.0–36.0)
MCV: 95.5 fL (ref 78.0–100.0)
Platelets: 95 10*3/uL — ABNORMAL LOW (ref 150–400)
RBC: 3.36 MIL/uL — ABNORMAL LOW (ref 3.87–5.11)
RDW: 13.5 % (ref 11.5–15.5)
WBC: 18.1 10*3/uL — AB (ref 4.0–10.5)

## 2015-06-15 NOTE — Lactation Note (Signed)
This note was copied from the chart of Kristen Patterson. Lactation Consultation Note  Patient Name: Kristen Patterson Today's Date: 06/15/2015 Reason for consult: Follow-up assessment with this mom and a NICU baby Twin A, now 27 hours old and 37 3/7 weeks CGA. The baby has an anterior short, thin frenulum, on the tip of her tongue, causing a heart shape with extension, and an upper lip frenulum that extends to the gum line. The baby was not able to maintain a latch, and kept  Coming on and off. Mom  Has large, rectangular (horizontally) shaped nipples. I afit mom with a 24 nipple shield, which fit mom well. The baby was not able to fit the nipple with shield in her mouth, causing her to pull away , unlatch and cry. Mom agreed to bottle feed EBM and then formula, to not stress the baby further at this time.  I advised mom to begin pumping consistently, along with hand expression, to protect her milk suply and to provide EBm for both her babies. Mom seemed to understand, although I know she is overwhelmed and exhausted. I complimented her on how reat she was handling her situation, and asked that she do her best with pumping.  Mom knows lactation is here this evening, and that she can call for questions/concerns.    Maternal Data    Feeding Feeding Type: Breast Milk Nipple Type: Slow - flow  LATCH Score/Interventions Latch: Repeated attempts needed to sustain latch, nipple held in mouth throughout feeding, stimulation needed to elicit sucking reflex. 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 Intervention(s):  Breastfeeding basics reviewed;Support Pillows;Position options;Skin to skin  LATCH Score: 6  Lactation Tools Discussed/Used     Consult Status Consult Status: Follow-up Date: 06/16/15 Follow-up type: In-patient    Fardeen Steinberger Anne 06/15/2015, 5:15 PM    

## 2015-06-15 NOTE — Progress Notes (Signed)
CSW attempted twice to meet with parents to introduce myself, offer support and complete assessment due to NICU admission of twin A, but they have had numerous visitors.  CSW will attempt again at a later time. 

## 2015-06-15 NOTE — Lactation Note (Signed)
This note was copied from the chart of Kristen Patterson. Lactation Consultation Note  Patient Name: Kristen Arora Spoonemore Today's Date: 06/15/2015 Reason for consult: Follow-up assessment with this mom of a now 37 3/7 week CGA twin B baby, weighing 5 lbs 11 ounces. Mom was not able to get baby lat ached. Seh was using cradle hold and trying to place her nipple in the baby's mouth. I had her use cross cradle, advised her to allow the baby's nose to rest close to her breast and why, . The baby latched easily, with good breast movement noted. Mom has lots of easily expressed colostrum, and had syringe/finger fed 5 ml's prior to this feeding.  I did note an anterior tongue thins, short frenulum, limiting tongue extension and movement. She also has an upper lip frenulum that is thick and extends to the gum line. 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 I called the pediatrician on call, Dr. Cooper, and made him aware of above.   I told mom to let me know if latching is uncomfortable. Mom denies discomfort at thsistime. I told mom I may try a nipple shield at some point today.  Mom does not make good eye contact when speaking to her, and has a semi flat affect, but does smile and make eye contact as I leave the room. MOm knows to call for questions/concerns.    Maternal Data    Feeding Feeding Type: Breast Fed  LATCH Score/Interventions Latch: Grasps breast easily, tongue down, lips flanged, rhythmical sucking. Intervention(s): Assist with latch  Audible Swallowing: A few with stimulation  Type of Nipple: Everted at rest and after stimulation (rectangular shaped nipples)  Comfort (Breast/Nipple): Soft / non-tender     Hold (Positioning): Assistance needed to correctly position infant at breast and maintain latch. Intervention(s): Breastfeeding basics reviewed;Support Pillows;Position options;Skin to skin  LATCH Score: 8  Lactation Tools Discussed/Used      Consult Status Consult Status: Follow-up Date: 06/15/15 Follow-up type: In-patient    Alynna Hargrove Anne 06/15/2015, 9:45 AM    

## 2015-06-15 NOTE — Progress Notes (Signed)
PPD #1 No problems, baby A stable in NICU, Baby B in room Afeb, VSS Fundus firm, NT at U-1 Continue routine postpartum care

## 2015-06-16 ENCOUNTER — Encounter (HOSPITAL_COMMUNITY): Payer: Self-pay | Admitting: Obstetrics and Gynecology

## 2015-06-16 MED ORDER — OXYCODONE-ACETAMINOPHEN 5-325 MG PO TABS: 1.0000 | ORAL_TABLET | ORAL | Status: DC | PRN

## 2015-06-16 MED ORDER — IBUPROFEN 600 MG PO TABS: 600.0000 mg | ORAL_TABLET | Freq: Four times a day (QID) | ORAL | Status: DC

## 2015-06-16 NOTE — Lactation Note (Addendum)
This note was copied from the chart of [name]. Lactation Consultation Note  Patient Name: [name] Today's Date: 06/16/2015 Reason for consult: Follow-up assessment Twins 46 hours old, baby girl "B" rooming in with mom, and baby girl "A" in NICU. Mom states that baby girl "B" is latching well and is being supplemented after each feeding. Offered to assist mom with latching baby girl "B" but mom declined. Mom aware of OP/BFSG and LC phone line assistance after D/C, and aware that NS a temporary device. Referred parents to Baby and Me booklet for number of diapers to expect by day of life and EBM storage guidelines. Discussed how to transport milk to hospital for baby girl "A" in NICU, and mom aware of pumping rooms in NICU. Mom states that her breasts are not starting to fill as far as she can tell. Enc mom to pump routinely after each feeding/at least 8 times/day for 15-20 minutes. Enc mom to hand express after pumping as well. Mom states that she has a Medela DEBP at home.   Maternal Data    Feeding    LATCH Score/Interventions                      Lactation Tools Discussed/Used     Consult Status Consult Status: PRN    Anthonyjames Bargar 06/16/2015, 10:50 AM    

## 2015-06-16 NOTE — Discharge Summary (Signed)
Obstetric Discharge Summary Reason for Admission: induction of labor Prenatal Procedures: none Intrapartum Procedures: spontaneous vaginal delivery Postpartum Procedures: none Complications-Operative and Postpartum: 2nd degree perineal laceration HEMOGLOBIN  Date Value Ref Range Status  06/15/2015 11.2* 12.0 - 15.0 g/dL Final   HCT  Date Value Ref Range Status  06/15/2015 32.1* 36.0 - 46.0 % Final    Physical Exam:  General: alert Lochia: appropriate Uterine Fundus: firm   Discharge Diagnoses: Di/di twin pregnancy at 337 weeks-vtx/breech, PIH  Discharge Information: Date: 06/16/2015 Activity: pelvic rest Diet: routine Medications: Ibuprofen and Percocet Condition: stable Instructions: refer to practice specific booklet Discharge to: home Follow-up Information    Follow up with Zyrion Coey D, MD. Schedule an appointment as soon as possible for a visit in 6 weeks.   Specialty:  Obstetrics and Gynecology   Contact information:   [address] [place] [id number] [phone]       Newborn Data:   Lendon KaCURTIS, GirlA Haely [[id number]][[id number]]  Live born female  Birth Weight:   APGAR: 4, 4   Colin BentonCURTIS, GirlB Keylen [[id number]][[id number]]  Live born female  Birth Weight: 5 lb 12.4 oz (2620 g) APGAR: 1, 8   Baby A in NICU, Baby B home with mom.  Raquon Milledge D 06/16/2015, 7:34 AM

## 2015-06-16 NOTE — Discharge Instructions (Signed)
As per discharge pamphlet °

## 2015-06-16 NOTE — Clinical Social Work Maternal (Addendum)
  CLINICAL SOCIAL WORK MATERNAL/CHILD NOTE  Patient Details  Name: Kristen Patterson MRN: [id number] Date of Birth: 08/24/1996  Date:  06/16/2015  Clinical Social Worker Initiating Note:  Audric Venn E. Brigitte Pulse, [place] Date/ Time Initiated:  06/16/15/1100     Child's Name:  Kristen Patterson, Kristen Patterson   Legal Guardian:   (Parents: Kristen Patterson and Kristen Patterson)   Need for Interpreter:  None   Date of Referral:        Reason for Referral:   (No referral-NICU admission)   Referral Source:      Address:  [address], [place], [place] [address]  Phone number:  [phone]   Household Members:  Spouse   Natural Supports (not living in the home):  Extended Family, Friends, Immediate Family (Parents report having a supportive family living in the area.  Kristen Patterson was with them today.)   Professional Supports:     Employment:     Type of Work:     Education:      Pensions consultant:  Multimedia programmer   Other Resources:      Cultural/Religious Considerations Which May Impact Care: None stated  Strengths:  Ability to meet basic needs , Compliance with medical plan , Home prepared for child , Understanding of illness, Pediatrician chosen  (Pediatric follow up will be with Dr. Rex Kras)   Risk Factors/Current Problems:  None   Cognitive State:  Alert , Insightful , Linear Thinking    Mood/Affect:  Interested , Tearful , Calm    CSW Assessment: CSW met with parents and Kristen Patterson in MOB's first floor room/143 to introduce myself, offer support and complete assessment due to admission to NICU of twin A.  MOB was holding twin B skin to skin.  Family reports this is a good time to talk with them and seemed appreciative of the support offered by CSW.  Parents calmly and openly shared their delivery story with CSW.  MOB was tearful as she talked about Kristen Patterson's admission to NICU and separation from them.  She reports being discharged today and understands that baby in NICU will not be, which made her  cry more.  CSW assisted MOB in processing her feelings and increasing a positive frame of mind.  Family is concerned about the difficulty the situation brings with having one baby in the hospital and one at home, especially when trying to spend time with the one in the hospital.  Kristen Patterson states since they live 45 minutes away, she will plan to come with them so that [name] will not have to be far from them.  CSW validated feelings and encouraged family to allow themselves to be emotional and process their feelings each day.  CSW discussed common emotions often experienced during the two weeks following delivery as well as provided education on signs and symptoms of perinatal mood disorders.  Family stated understanding. Parents report having everything they need for babies at home, including two separate beds for their newborns.  They report having an excellent support system.  CSW offered gas cards as parents will be traveling from [place] to visit [facility] in the hospital after MOB's discharge.  Parents accepted and were very appreciative.  CSW has no social concerns at this time and identifies no barriers to discharge when medically ready.  CSW Plan/Description:  Psychosocial Support and Ongoing Assessment of Needs, Patient/Family Education     Alphonzo Cruise, [place] 06/16/2015, 1:30 PM

## 2015-06-16 NOTE — Progress Notes (Signed)
PPD #2 Doing well, baby stable in NICU, baby B still in room Afeb, VSS Fundus firm D/c home

## 2015-06-17 ENCOUNTER — Ambulatory Visit: Payer: Self-pay

## 2015-06-17 NOTE — Lactation Note (Signed)
This note was copied from the chart of GirlA Averlee Khalid. Lactation Consultation Note  Patient Name: Kristen Patterson ZOXWR'UToday's Date: 06/17/2015 Reason for consult: Follow-up assessment;NICU baby NICU baby girl "A: 6077 hours old. Called to NICU to assist with latching baby at breast. After several attempts, fitted mom a #16 NS. Attempted to latch using a NS, but baby not willing to suckle at breast. Mom had expressed breast milk at bedside and gave 1 ml using a curve-tipped syringe while allowing baby to suckle this LC's gloved finger. Placed a drop on NS, but baby would not latch and suckle. Baby sleepy at breast. Parents aware of baby's tight frenulum and the need to us NS. Parents also aware that NS a temporary device and know about continuing to use DEBP for good supply as well as following baby's weights closely while using NS. Discussed the need to supplement along with nursing until baby able to nurse well at breast and gain weight. Parents also aware of OP/BFSG and LC phone line assistance after D/C.  Maternal Data    Feeding Feeding Type: Breast Milk with Formula added Nipple Type: Regular Length of feed: 10 min  LATCH Score/Interventions Latch: Repeated attempts needed to sustain latch, nipple held in mouth throughout feeding, stimulation needed to elicit sucking reflex. Intervention(s): Adjust position;Assist with latch;Breast compression  Audible Swallowing: None Intervention(s): Hand expression  Type of Nipple: Flat  Comfort (Breast/Nipple): Soft / non-tender     Hold (Positioning): Assistance needed to correctly position infant at breast and maintain latch.  LATCH Score: 5  Lactation Tools Discussed/Used     Consult Status Consult Status: PRN    Geralynn OchsWILLIARD, Sherilyn Windhorst 06/17/2015, 4:53 PM

## 2015-07-03 ENCOUNTER — Inpatient Hospital Stay (HOSPITAL_COMMUNITY)
Admission: AD | Admit: 2015-07-03 | Payer: Federal, State, Local not specified - PPO | Source: Ambulatory Visit | Admitting: Obstetrics and Gynecology

## 2015-07-07 ENCOUNTER — Encounter (HOSPITAL_COMMUNITY): Payer: Self-pay

## 2015-11-24 LAB — OB RESULTS CONSOLE GC/CHLAMYDIA
Chlamydia: NEGATIVE
Gonorrhea: NEGATIVE

## 2015-11-24 LAB — OB RESULTS CONSOLE HIV ANTIBODY (ROUTINE TESTING): HIV: NONREACTIVE

## 2015-11-24 LAB — OB RESULTS CONSOLE RPR: RPR: NONREACTIVE

## 2015-11-24 LAB — OB RESULTS CONSOLE HEPATITIS B SURFACE ANTIGEN: HEP B S AG: NEGATIVE

## 2015-11-24 LAB — OB RESULTS CONSOLE RUBELLA ANTIBODY, IGM: RUBELLA: IMMUNE

## 2015-12-07 NOTE — L&D Delivery Note (Addendum)
Delivery Note Pt reached complete dilation and pushed great.  At 11:49 PM a healthy female was delivered via Vaginal, Spontaneous Delivery (Presentation: Right Occiput Anterior).  APGAR: 9, 9; weight 8#15oz .   Placenta status: Intact, Spontaneous.  Cord: 3 vessels with the following complications: Nuchal x 1 delivered through. Anesthesia: Epidural  Episiotomy: None Lacerations: 2nd degree;Perineal Suture Repair: 3.0 vicryl rapide Est. Blood Loss (mL):  175ml  Mom to postpartum.  Baby to Couplet care / Skin to Skin. D/w pt circumcision and they desire to proceed in the hospital.  State have paid at office but not hospital yet.  Advised must do so before we can proceed.  Oliver PilaICHARDSON,Bethzy Hauck W 06/18/2016, 12:09 AM

## 2016-05-25 LAB — OB RESULTS CONSOLE GBS: GBS: NEGATIVE

## 2016-06-17 ENCOUNTER — Inpatient Hospital Stay (HOSPITAL_COMMUNITY): Payer: Federal, State, Local not specified - PPO | Admitting: Anesthesiology

## 2016-06-17 ENCOUNTER — Encounter (HOSPITAL_COMMUNITY): Payer: Self-pay

## 2016-06-17 ENCOUNTER — Inpatient Hospital Stay (HOSPITAL_COMMUNITY)
Admission: AD | Admit: 2016-06-17 | Discharge: 2016-06-19 | DRG: 775 | Disposition: A | Discharge status: Home / Self Care | Payer: Federal, State, Local not specified - PPO | Source: Ambulatory Visit | Attending: Obstetrics and Gynecology | Admitting: Obstetrics and Gynecology

## 2016-06-17 DIAGNOSIS — O99214 Obesity complicating childbirth: Secondary | ICD-10-CM | POA: Diagnosis present

## 2016-06-17 DIAGNOSIS — D649 Anemia, unspecified: Secondary | ICD-10-CM | POA: Diagnosis present

## 2016-06-17 DIAGNOSIS — Z3A38 38 weeks gestation of pregnancy: Secondary | ICD-10-CM | POA: Diagnosis not present

## 2016-06-17 DIAGNOSIS — O9902 Anemia complicating childbirth: Secondary | ICD-10-CM | POA: Diagnosis present

## 2016-06-17 DIAGNOSIS — E669 Obesity, unspecified: Secondary | ICD-10-CM | POA: Diagnosis present

## 2016-06-17 DIAGNOSIS — Z6833 Body mass index (BMI) 33.0-33.9, adult: Secondary | ICD-10-CM

## 2016-06-17 LAB — CBC
HEMATOCRIT: 34.9 % — AB (ref 36.0–46.0)
HEMOGLOBIN: 12.2 g/dL (ref 12.0–15.0)
MCH: 30.9 pg (ref 26.0–34.0)
MCHC: 35 g/dL (ref 30.0–36.0)
MCV: 88.4 fL (ref 78.0–100.0)
Platelets: 240 10*3/uL (ref 150–400)
RBC: 3.95 MIL/uL (ref 3.87–5.11)
RDW: 12.7 % (ref 11.5–15.5)
WBC: 12.1 10*3/uL — AB (ref 4.0–10.5)

## 2016-06-17 LAB — TYPE AND SCREEN
ABO/RH(D): A POS
Antibody Screen: NEGATIVE

## 2016-06-17 MED ORDER — DIPHENHYDRAMINE HCL 50 MG/ML IJ SOLN: 12.5000 mg | INTRAMUSCULAR | Status: DC | PRN

## 2016-06-17 MED ORDER — PHENYLEPHRINE 40 MCG/ML (10ML) SYRINGE FOR IV PUSH (FOR BLOOD PRESSURE SUPPORT)
80.0000 ug | PREFILLED_SYRINGE | INTRAVENOUS | Status: DC | PRN
  Filled 2016-06-17: qty 10
  Filled 2016-06-17: qty 5

## 2016-06-17 MED ORDER — OXYCODONE-ACETAMINOPHEN 5-325 MG PO TABS: 1.0000 | ORAL_TABLET | ORAL | Status: DC | PRN

## 2016-06-17 MED ORDER — OXYCODONE-ACETAMINOPHEN 5-325 MG PO TABS
2.0000 | ORAL_TABLET | ORAL | Status: DC | PRN
  Administered 2016-06-18: 2 via ORAL
  Filled 2016-06-17: qty 2

## 2016-06-17 MED ORDER — EPHEDRINE 5 MG/ML INJ
10.0000 mg | INTRAVENOUS | Status: DC | PRN
  Filled 2016-06-17: qty 2

## 2016-06-17 MED ORDER — FENTANYL 2.5 MCG/ML BUPIVACAINE 1/10 % EPIDURAL INFUSION (WH - ANES)
14.0000 mL/h | INTRAMUSCULAR | Status: DC | PRN
  Administered 2016-06-17: 14.5 mL/h via EPIDURAL
  Administered 2016-06-17: 14 mL/h via EPIDURAL
  Filled 2016-06-17: qty 125

## 2016-06-17 MED ORDER — LIDOCAINE HCL (PF) 1 % IJ SOLN
30.0000 mL | INTRAMUSCULAR | Status: DC | PRN
  Administered 2016-06-17: 30 mL via SUBCUTANEOUS
  Filled 2016-06-17: qty 30

## 2016-06-17 MED ORDER — ACETAMINOPHEN 325 MG PO TABS: 650.0000 mg | ORAL_TABLET | ORAL | Status: DC | PRN

## 2016-06-17 MED ORDER — LACTATED RINGERS IV SOLN
500.0000 mL | Freq: Once | INTRAVENOUS | Status: AC
  Administered 2016-06-17: 1000 mL via INTRAVENOUS

## 2016-06-17 MED ORDER — LIDOCAINE HCL (PF) 1 % IJ SOLN
INTRAMUSCULAR | Status: DC | PRN
  Administered 2016-06-17: 5 mL via EPIDURAL
  Administered 2016-06-17: 4 mL via EPIDURAL

## 2016-06-17 MED ORDER — OXYTOCIN BOLUS FROM INFUSION
500.0000 mL | INTRAVENOUS | Status: DC
  Administered 2016-06-17: 500 mL via INTRAVENOUS

## 2016-06-17 MED ORDER — FLEET ENEMA 7-19 GM/118ML RE ENEM: 1.0000 | ENEMA | RECTAL | Status: DC | PRN

## 2016-06-17 MED ORDER — SOD CITRATE-CITRIC ACID 500-334 MG/5ML PO SOLN
30.0000 mL | ORAL | Status: DC | PRN
Start: 2016-06-17 — End: 2016-06-18

## 2016-06-17 MED ORDER — LACTATED RINGERS IV SOLN
500.0000 mL | INTRAVENOUS | Status: DC | PRN
Start: 2016-06-17 — End: 2016-06-18

## 2016-06-17 MED ORDER — PHENYLEPHRINE 40 MCG/ML (10ML) SYRINGE FOR IV PUSH (FOR BLOOD PRESSURE SUPPORT)
80.0000 ug | PREFILLED_SYRINGE | INTRAVENOUS | Status: DC | PRN
  Filled 2016-06-17: qty 5

## 2016-06-17 MED ORDER — OXYTOCIN 40 UNITS IN LACTATED RINGERS INFUSION - SIMPLE MED
2.5000 [IU]/h | INTRAVENOUS | Status: DC
  Administered 2016-06-18: 2.5 [IU]/h via INTRAVENOUS
  Filled 2016-06-17: qty 1000

## 2016-06-17 MED ORDER — LACTATED RINGERS IV SOLN
INTRAVENOUS | Status: DC
  Administered 2016-06-17: 22:00:00 via INTRAVENOUS

## 2016-06-17 MED ORDER — ONDANSETRON HCL 4 MG/2ML IJ SOLN: 4.0000 mg | Freq: Four times a day (QID) | INTRAMUSCULAR | Status: DC | PRN

## 2016-06-17 NOTE — MAU Note (Signed)
Pt reports contractions, denies bleeding or ROM.  

## 2016-06-17 NOTE — Anesthesia Procedure Notes (Signed)
Epidural Patient location during procedure: OB Start time: 06/17/2016 9:29 PM  Staffing Anesthesiologist: Mal AmabileFOSTER, Hedwig Mcfall Performed by: anesthesiologist   Preanesthetic Checklist Completed: patient identified, site marked, surgical consent, pre-op evaluation, timeout performed, IV checked, risks and benefits discussed and monitors and equipment checked  Epidural Patient position: sitting Prep: site prepped and draped and DuraPrep Patient monitoring: continuous pulse ox and blood pressure Approach: midline Location: L3-L4 Injection technique: LOR air  Needle:  Needle type: Tuohy  Needle gauge: 17 G Needle length: 9 cm and 9 Needle insertion depth: 4 cm Catheter type: closed end flexible Catheter size: 19 Gauge Catheter at skin depth: 9 cm Test dose: negative and Other  Assessment Events: blood not aspirated, injection not painful, no injection resistance, negative IV test and no paresthesia  Additional Notes Patient identified. Risks and benefits discussed including failed block, incomplete  Pain control, post dural puncture headache, nerve damage, paralysis, blood pressure Changes, nausea, vomiting, reactions to medications-both toxic and allergic and post Partum back pain. All questions were answered. Patient expressed understanding and wished to proceed. Sterile technique was used throughout procedure. Epidural site was Dressed with sterile barrier dressing. No paresthesias, signs of intravascular injection Or signs of intrathecal spread were encountered.  Patient was more comfortable after the epidural was dosed. Please see RN's note for documentation of vital signs and FHR which are stable.

## 2016-06-17 NOTE — Progress Notes (Signed)
Patient ID: Kristen MasterRhiannon Dabbs, female   DOB: Apr 18, 1996, 20 y.o.   MRN: [id number] Comfortable with epidural afeb vss FHR category 1  Cervix 5-6/c/-1 AROM clear  Follow progress

## 2016-06-17 NOTE — Anesthesia Preprocedure Evaluation (Signed)
Anesthesia Evaluation  Patient identified by MRN, date of birth, ID band Patient awake    Reviewed: Allergy & Precautions, NPO status , Patient's Chart, lab work & pertinent test results  Airway Mallampati: II  TM Distance: >3 FB Neck ROM: Full    Dental no notable dental hx. (+) Teeth Intact   Pulmonary neg pulmonary ROS,    Pulmonary exam normal breath sounds clear to auscultation       Cardiovascular negative cardio ROS Normal cardiovascular exam Rhythm:Regular Rate:Normal     Neuro/Psych negative neurological ROS  negative psych ROS   GI/Hepatic negative GI ROS, Neg liver ROS,   Endo/Other  Obesity  Renal/GU negative Renal ROS  negative genitourinary   Musculoskeletal negative musculoskeletal ROS (+)   Abdominal (+) + obese,   Peds  Hematology  (+) anemia ,   Anesthesia Other Findings   Reproductive/Obstetrics                             Anesthesia Physical Anesthesia Plan  ASA: II  Anesthesia Plan: Epidural   Post-op Pain Management:    Induction:   Airway Management Planned: Natural Airway  Additional Equipment:   Intra-op Plan:   Post-operative Plan:   Informed Consent: I have reviewed the patients History and Physical, chart, labs and discussed the procedure including the risks, benefits and alternatives for the proposed anesthesia with the patient or authorized representative who has indicated his/her understanding and acceptance.     Plan Discussed with: Anesthesiologist  Anesthesia Plan Comments:         Anesthesia Quick Evaluation

## 2016-06-17 NOTE — H&P (Signed)
Kristen Patterson is a 20 y.o. female G2P1002 at 3738 6/7 weeks (EDD 06/25/16 by LMP c/w 9 week US) presenting for regula contractions and cervical change to 4cm.  Prenatal care complicated only by a short interpregnancy interval with patient delivering twins one year ago in 7/16.  Maternal Medical History:  Reason for admission: Contractions.   Contractions: Onset was 3-5 hours ago.   Frequency: regular.   Perceived severity is strong.    Fetal activity: Perceived fetal activity is normal.    Prenatal complications: no prenatal complications Prenatal Complications - Diabetes: none.    OB History    Gravida Para Term Preterm AB TAB SAB Ectopic Multiple Living   2 1 1  0 0 0 0 0 1 2    7/16  NSVD x 2 twins (6#5oz, 5#12oz)  Past Medical History  Diagnosis Date  . Chronic UTI     as child  . Anemia     on Iron at home   Past Surgical History  Procedure Laterality Date  . Anterior cruciate ligament repair    . Wisdom tooth extraction    . Cystostomy w/ bladder dilation    . Cesarean section multi-gestational N/A 06/14/2015    Procedure: MULTI-GESTATIONAL VAGINAL DELIVERY, POSSIBLE CESAREAN SECTION;  Surgeon: Lavina Hammanodd Meisinger, MD;  Location: [facility];  Service: Obstetrics;  Laterality: N/A;   Family History: family history is not on file. Social History:  reports that she has never smoked. She has never used smokeless tobacco. She reports that she does not drink alcohol or use illicit drugs.   Prenatal Transfer Tool  Maternal Diabetes: No Genetic Screening: Declined Maternal Ultrasounds/Referrals: Normal Fetal Ultrasounds or other Referrals:  None Maternal Substance Abuse:  No Significant Maternal Medications:  None Significant Maternal Lab Results:  None Other Comments:  None  Review of Systems  Gastrointestinal: Positive for abdominal pain.    Dilation: 4 Effacement (%): 90 Station: -2 Exam by:: Sharen Hintaroline Brewer RNC Blood pressure 140/87, pulse 89, temperature 98.2 F (36.8  C), temperature source Oral, resp. rate 18, height 5\' 9"  (1.753 m), weight 226 lb (102.513 kg), last menstrual period 09/20/2015, SpO2 99 %, unknown if currently breastfeeding. Maternal Exam:  Uterine Assessment: Contraction strength is moderate.  Contraction frequency is regular.   Abdomen: Patient reports no abdominal tenderness. Fetal presentation: vertex  Introitus: Normal vulva. Normal vagina.    Physical Exam  Constitutional: She appears well-developed and well-nourished.  Cardiovascular: Normal rate.   Respiratory: Effort normal.  Genitourinary: Vagina normal.  Neurological: She is alert.  Psychiatric: She has a normal mood and affect.    Prenatal labs: ABO, Rh:  A positive Antibody:  Negative Rubella:  Immune RPR:  NR  HBsAg:   Neg HIV:   NR GBS:   Neg Declined genetic screens One hour GCT 152 3 hour GTT WNL  Assessment/Plan: Pt admitted with painful contractions and cervical change.  Requesting epidural, will allow and then AROM.     Oliver PilaRICHARDSON,Karmin Kasprzak W 06/17/2016, 9:48 PM

## 2016-06-18 ENCOUNTER — Encounter (HOSPITAL_COMMUNITY): Payer: Self-pay | Admitting: *Deleted

## 2016-06-18 LAB — CBC
HCT: 33.1 % — ABNORMAL LOW (ref 36.0–46.0)
HEMOGLOBIN: 11.5 g/dL — AB (ref 12.0–15.0)
MCH: 31 pg (ref 26.0–34.0)
MCHC: 34.7 g/dL (ref 30.0–36.0)
MCV: 89.2 fL (ref 78.0–100.0)
PLATELETS: 223 10*3/uL (ref 150–400)
RBC: 3.71 MIL/uL — AB (ref 3.87–5.11)
RDW: 12.9 % (ref 11.5–15.5)
WBC: 11.9 10*3/uL — ABNORMAL HIGH (ref 4.0–10.5)

## 2016-06-18 LAB — RPR: RPR Ser Ql: NONREACTIVE

## 2016-06-18 MED ORDER — IBUPROFEN 600 MG PO TABS
600.0000 mg | ORAL_TABLET | Freq: Four times a day (QID) | ORAL | Status: DC
  Administered 2016-06-18 – 2016-06-19 (×4): 600 mg via ORAL
  Filled 2016-06-18 (×4): qty 1

## 2016-06-18 MED ORDER — PRENATAL MULTIVITAMIN CH
1.0000 | ORAL_TABLET | Freq: Every day | ORAL | Status: DC
  Administered 2016-06-18: 1 via ORAL
  Filled 2016-06-18: qty 1

## 2016-06-18 MED ORDER — TETANUS-DIPHTH-ACELL PERTUSSIS 5-2.5-18.5 LF-MCG/0.5 IM SUSP: 0.5000 mL | Freq: Once | INTRAMUSCULAR | Status: DC

## 2016-06-18 MED ORDER — WITCH HAZEL-GLYCERIN EX PADS: 1.0000 "application " | MEDICATED_PAD | CUTANEOUS | Status: DC | PRN

## 2016-06-18 MED ORDER — DIPHENHYDRAMINE HCL 25 MG PO CAPS
25.0000 mg | ORAL_CAPSULE | Freq: Four times a day (QID) | ORAL | Status: DC | PRN
  Administered 2016-06-18 – 2016-06-19 (×2): 25 mg via ORAL
  Filled 2016-06-18 (×2): qty 1

## 2016-06-18 MED ORDER — ACETAMINOPHEN 325 MG PO TABS: 650.0000 mg | ORAL_TABLET | ORAL | Status: DC | PRN

## 2016-06-18 MED ORDER — SIMETHICONE 80 MG PO CHEW: 80.0000 mg | CHEWABLE_TABLET | ORAL | Status: DC | PRN

## 2016-06-18 MED ORDER — ONDANSETRON HCL 4 MG/2ML IJ SOLN: 4.0000 mg | INTRAMUSCULAR | Status: DC | PRN

## 2016-06-18 MED ORDER — OXYCODONE HCL 5 MG PO TABS
10.0000 mg | ORAL_TABLET | ORAL | Status: DC | PRN
  Administered 2016-06-18 – 2016-06-19 (×2): 10 mg via ORAL
  Filled 2016-06-18 (×2): qty 2

## 2016-06-18 MED ORDER — ZOLPIDEM TARTRATE 5 MG PO TABS: 5.0000 mg | ORAL_TABLET | Freq: Every evening | ORAL | Status: DC | PRN

## 2016-06-18 MED ORDER — COCONUT OIL OIL
1.0000 | TOPICAL_OIL | Status: DC | PRN
Start: 2016-06-18 — End: 2016-06-19
  Administered 2016-06-19: 1 via TOPICAL
  Filled 2016-06-18: qty 120

## 2016-06-18 MED ORDER — OXYCODONE HCL 5 MG PO TABS: 5.0000 mg | ORAL_TABLET | ORAL | Status: DC | PRN

## 2016-06-18 MED ORDER — ONDANSETRON HCL 4 MG PO TABS: 4.0000 mg | ORAL_TABLET | ORAL | Status: DC | PRN

## 2016-06-18 MED ORDER — SENNOSIDES-DOCUSATE SODIUM 8.6-50 MG PO TABS
2.0000 | ORAL_TABLET | ORAL | Status: DC
  Administered 2016-06-19: 2 via ORAL
  Filled 2016-06-18: qty 2

## 2016-06-18 MED ORDER — BENZOCAINE-MENTHOL 20-0.5 % EX AERO: 1.0000 "application " | INHALATION_SPRAY | CUTANEOUS | Status: DC | PRN

## 2016-06-18 MED ORDER — DIBUCAINE 1 % RE OINT: 1.0000 "application " | TOPICAL_OINTMENT | RECTAL | Status: DC | PRN

## 2016-06-18 NOTE — Lactation Note (Signed)
This note was copied from a baby's chart. Lactation Consultation Note; Initial visit baby now 7514 hours old. Mom reports he has had several good feedings since birth. LS 9 by RN. Baby sucking on pacifier. Suggested offering breast and mom agreeable. Baby off to sleep did not latch. Encouraged mom not to use pacifier- to put him to the breast whenever she sees feeding cues. Mom breastfeed 37 week twins one year ago for 6 [employer]. One baby was in NICU and she pumped. Reports no pain when baby is latched. BF brochure given. Reviewed our phone number, OP appointments and BFSG as resources for support after DC. No questions at present. To call for assist prn  Patient Name: Kristen Patterson ZOXWR'UToday's Date: 06/18/2016 Reason for consult: Initial assessment   Maternal Data Formula Feeding for Exclusion: No Does the patient have breastfeeding experience prior to this delivery?: Yes  Feeding Feeding Type: Breast Fed  LATCH Score/Interventions Latch: Too sleepy or reluctant, no latch achieved, no sucking elicited.  Audible Swallowing: None  Type of Nipple: Everted at rest and after stimulation  Comfort (Breast/Nipple): Soft / non-tender     Hold (Positioning): Assistance needed to correctly position infant at breast and maintain latch.  LATCH Score: 5  Lactation Tools Discussed/Used     Consult Status Consult Status: Follow-up Date: 06/19/16 Follow-up type: In-patient    Pamelia HoitWeeks, Donaldson Richter D 06/18/2016, 1:59 PM

## 2016-06-18 NOTE — Anesthesia Postprocedure Evaluation (Signed)
Anesthesia Post Note  Patient: Kristen Patterson  Procedure(s) Performed: * No procedures listed *  Patient location during evaluation: Mother Baby Anesthesia Type: Epidural Level of consciousness: awake and alert and oriented Pain management: satisfactory to patient Vital Signs Assessment: post-procedure vital signs reviewed and stable Respiratory status: spontaneous breathing and nonlabored ventilation Cardiovascular status: stable Postop Assessment: no headache, no backache, no signs of nausea or vomiting, adequate PO intake and patient able to bend at knees (patient up walking) Anesthetic complications: no     Last Vitals:  Filed Vitals:   06/18/16 0300 06/18/16 0801  BP: 117/51 111/68  Pulse: 95 80  Temp: 36.8 C 36.5 C  Resp: 18 16    Last Pain:  Filed Vitals:   06/18/16 0801  PainSc: 0-No pain   Pain Goal:                 [name],Kristen Patterson

## 2016-06-18 NOTE — Progress Notes (Signed)
Post Partum Day 1 Subjective: no complaints, up ad lib, voiding, tolerating PO and nl lochia, pain controlled  Objective: Blood pressure 111/68, pulse 80, temperature 97.7 F (36.5 C), temperature source Oral, resp. rate 16, height 5\' 9"  (1.753 m), weight 102.513 kg (226 lb), last menstrual period 09/20/2015, SpO2 97 %, unknown if currently breastfeeding.  Physical Exam:  General: alert and no distress Lochia: appropriate Uterine Fundus: firm   Recent Labs  06/17/16 2100 06/18/16 0804  HGB 12.2 11.5*  HCT 34.9* 33.1*    Assessment/Plan: Plan for discharge tomorrow, Breastfeeding and Lactation consult.  Routine care.     LOS: 1 day   Bovard-Stuckert, Kristen Patterson 06/18/2016, 8:17 AM

## 2016-06-19 MED ORDER — PRENATAL MULTIVITAMIN CH: 1.0000 | ORAL_TABLET | Freq: Every day | ORAL | Status: DC

## 2016-06-19 MED ORDER — IBUPROFEN 800 MG PO TABS: 800.0000 mg | ORAL_TABLET | Freq: Three times a day (TID) | ORAL | Status: DC | PRN

## 2016-06-19 NOTE — Progress Notes (Signed)
Assumed care of mom and baby.  Mom holding baby in blankets. Dad at bedside. 

## 2016-06-19 NOTE — Progress Notes (Signed)
Mom in tears over sore nipples, and baby crying.  We tried supplementing with a 5fr NG and syringe taped to the nipple to work as an SMS.  Mom adamant about giving baby a bottle.  Similac 19 cal given in a bottle. 

## 2016-06-19 NOTE — Discharge Summary (Signed)
OB Discharge Summary     Patient Name: Kristen Patterson DOB: Feb 01, 1996 MRN: [id number]  Date of admission: 06/17/2016 Delivering MD: Huel Cote   Date of discharge: 06/19/2016  Admitting diagnosis: 38WKS CTX Intrauterine pregnancy: [redacted]w[redacted]d     Secondary diagnosis:  Active Problems:   Normal labor   NSVD (normal spontaneous vaginal delivery)  Additional problems: N/A     Discharge diagnosis: Term Pregnancy Delivered                                                                                                Post partum procedures:none  Augmentation: AROM  Complications: None  Hospital course:  Onset of Labor With Vaginal Delivery     20 y.o. yo G2P2003 at [redacted]w[redacted]d was admitted in Active Labor on 06/17/2016. Patient had an uncomplicated labor course as follows:  Membrane Rupture Time/Date: 10:03 PM ,06/17/2016   Intrapartum Procedures: Episiotomy: None [1]                                         Lacerations:  2nd degree [3];Perineal [11]  Patient had a delivery of a Viable infant. 06/17/2016  Information for the patient's newborn:  Manpreet, Kemmer [[id number]]  Delivery Method: Vaginal, Spontaneous Delivery (Filed from Delivery Summary)    Pateint had an uncomplicated postpartum course.  She is ambulating, tolerating a regular diet, passing flatus, and urinating well. Patient is discharged home in stable condition on 06/19/2016.    Physical exam  Filed Vitals:   06/18/16 0801 06/18/16 1541 06/18/16 1810 06/19/16 0536  BP: 111/68 123/64 116/76 111/61  Pulse: 80 60 78 68  Temp: 97.7 F (36.5 C) 97.8 F (36.6 C) 97.7 F (36.5 C) 97.5 F (36.4 C)  TempSrc: Oral Oral Oral Oral  Resp: Height:      Weight:      SpO2:   98%    General: alert and no distress Lochia: appropriate Uterine Fundus: firm Labs: Lab Results  Component Value Date   WBC 11.9* 06/18/2016   HGB 11.5* 06/18/2016   HCT 33.1* 06/18/2016   MCV 89.2 06/18/2016   PLT 223  06/18/2016   CMP Latest Ref Rng 06/13/2015  Glucose 65 - 99 mg/dL 83  BUN 6 - 20 mg/dL 7  Creatinine 1.30 - 8.65 mg/dL 7.84  Sodium 696 - 295 mmol/L 132(L)  Potassium 3.5 - 5.1 mmol/L 3.9  Chloride 101 - 111 mmol/L 108  CO2 22 - 32 mmol/L 18(L)  Calcium 8.9 - 10.3 mg/dL 2.8(U)  Total Protein 6.5 - 8.1 g/dL 1.3(K)  Total Bilirubin 0.3 - 1.2 mg/dL 0.6  Alkaline Phos 38 - 126 U/L 231(H)  AST 15 - 41 U/L 21  ALT 14 - 54 U/L 11(L)    Discharge instruction: per After Visit Summary and "Baby and Me Booklet".  After visit meds:    Medication List    TAKE these medications        acetaminophen 325 MG tablet  Commonly known as:  TYLENOL  Take 650 mg by mouth every 6 (six) hours as needed for mild pain.     ibuprofen 800 MG tablet  Commonly known as:  ADVIL,MOTRIN  Take 1 tablet (800 mg total) by mouth every 8 (eight) hours as needed for moderate pain.     prenatal multivitamin Tabs tablet  Take 1 tablet by mouth daily at 12 noon.        Diet: routine diet  Activity: Advance as tolerated. Pelvic rest for 6 weeks.   Outpatient follow up:6 weeks Follow up Appt:No future appointments. Follow up Visit:No Follow-up on file.  Postpartum contraception: Progesterone only pills  Newborn Data: Live born female  Birth Weight: 8 lb 15 oz (4055 g) APGAR: 9, 9  Baby Feeding: Breast Disposition:home with mother   06/19/2016 Sherian ReinBovard-Stuckert, Phillip Maffei, MD

## 2016-06-19 NOTE — Progress Notes (Signed)
Post Partum Day 2 Subjective: no complaints, up ad lib, voiding, tolerating PO and nl lochia, pain controlled  Objective: Blood pressure 111/61, pulse 68, temperature 97.5 F (36.4 C), temperature source Oral, resp. rate 18, height 5\' 9"  (1.753 m), weight 102.513 kg (226 lb), last menstrual period 09/20/2015, SpO2 98 %, unknown if currently breastfeeding.  Physical Exam:  General: alert and no distress Lochia: appropriate Uterine Fundus: firm    Recent Labs  06/17/16 2100 06/18/16 0804  HGB 12.2 11.5*  HCT 34.9* 33.1*    Assessment/Plan: Discharge home, Breastfeeding and Lactation consult.  D/c with motrin and PNV.  F/u 6 weeks.     LOS: 2 days   Bovard-Stuckert, Treon Kehl 06/19/2016, 8:07 AM

## 2016-06-25 ENCOUNTER — Inpatient Hospital Stay (HOSPITAL_COMMUNITY): Payer: Federal, State, Local not specified - PPO

## 2016-12-06 NOTE — L&D Delivery Note (Signed)
Delivery Note Pt pushed with 2 contractions for delivery.  At 3:11 AM a viable and healthy female was delivered via Vaginal, Spontaneous Delivery (Presentation: OA; ROT ).  APGAR: 8, 9; weight P .   Placenta status: delivered, intact .  Cord: 3V with the following complications: nuchal.    Anesthesia:  epidural Episiotomy: None Lacerations:  L Labial;2nd degree Suture Repair: 3.0 vicryl rapide Est. Blood Loss (mL):  200cc  Mom to postpartum.  Baby to Couplet care / Skin to Skin.  Kristen Patterson 08/30/2017, 3:32 AM  Bo/A+/RI/Tdap in PNC/Contra?

## 2017-02-07 IMAGING — US US OB LIMITED
1 series · 10 of 10 positions shown · non-contrast
Comparison: none

[Series 1: us ob limited · 10 of 10 slices shown]
[im 1/10]
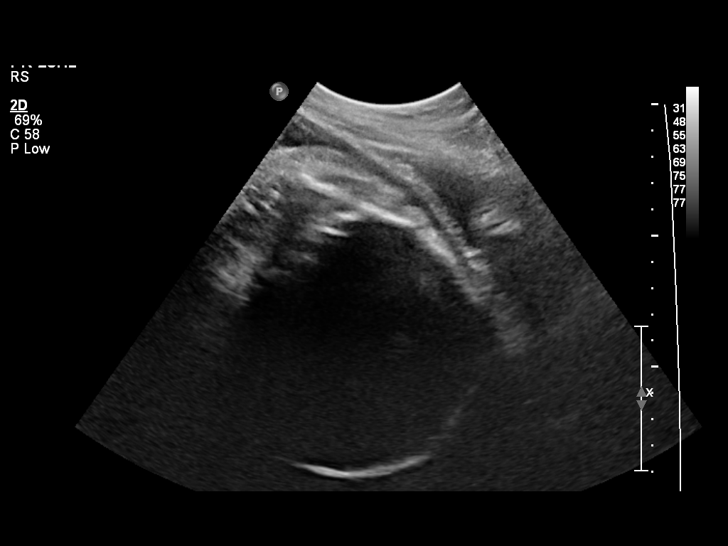
[im 2/10]
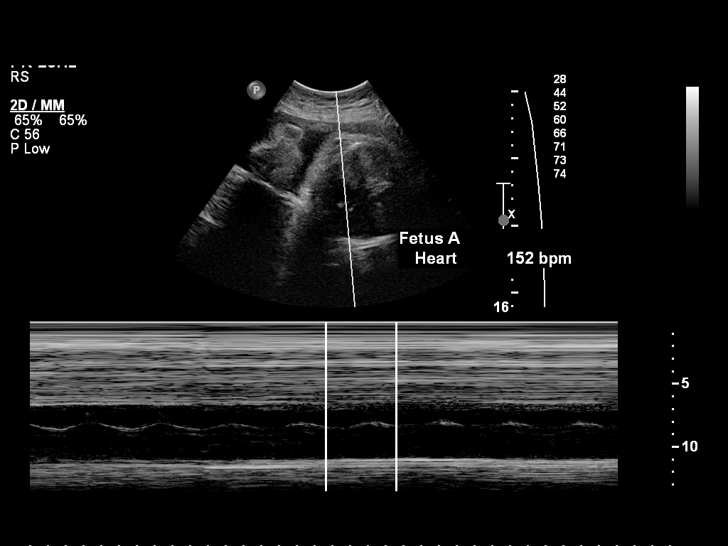
[im 3/10]
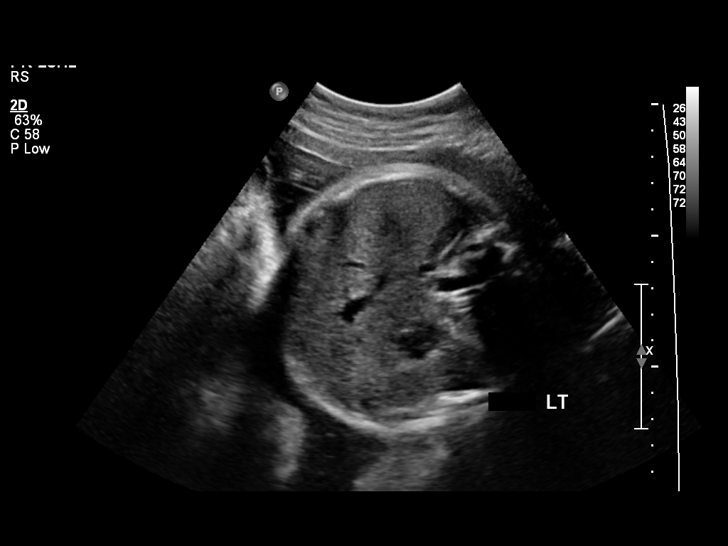
[im 4/10]
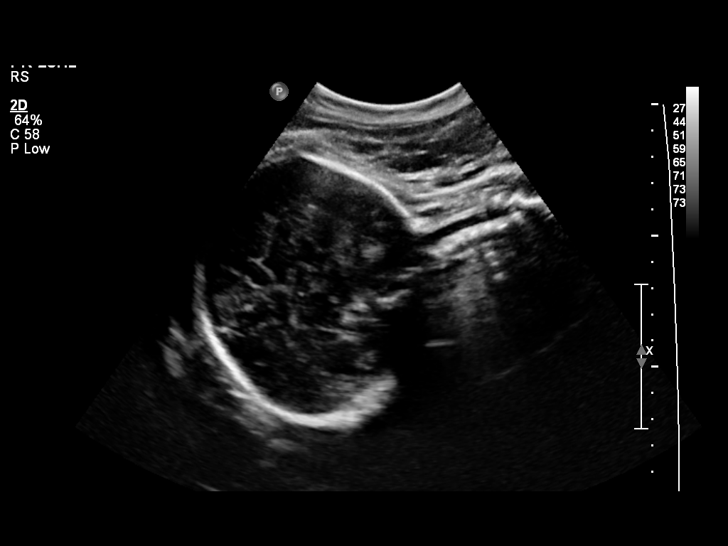
[im 5/10]
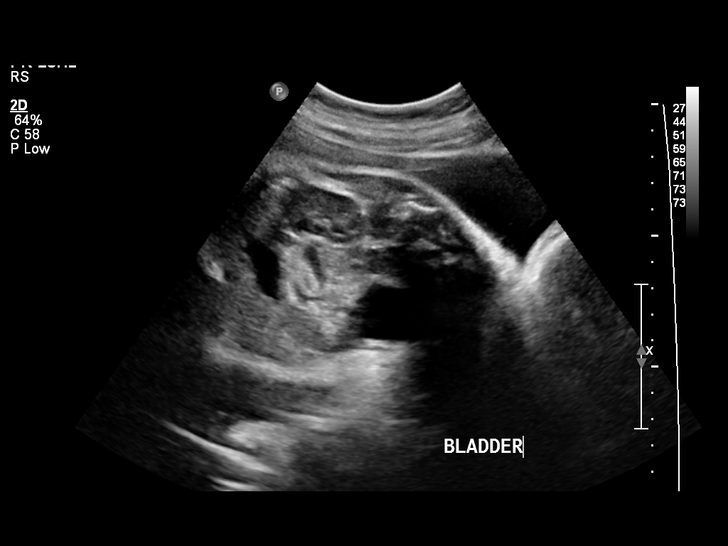
[im 6/10]
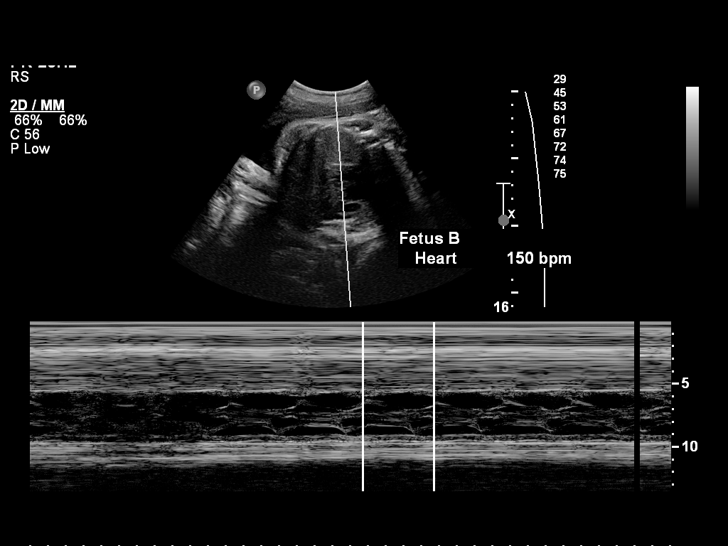
[im 7/10]
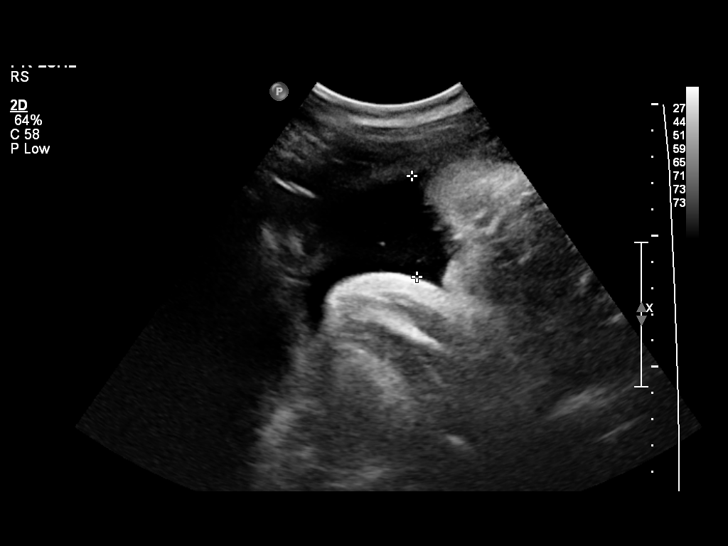
[im 8/10]
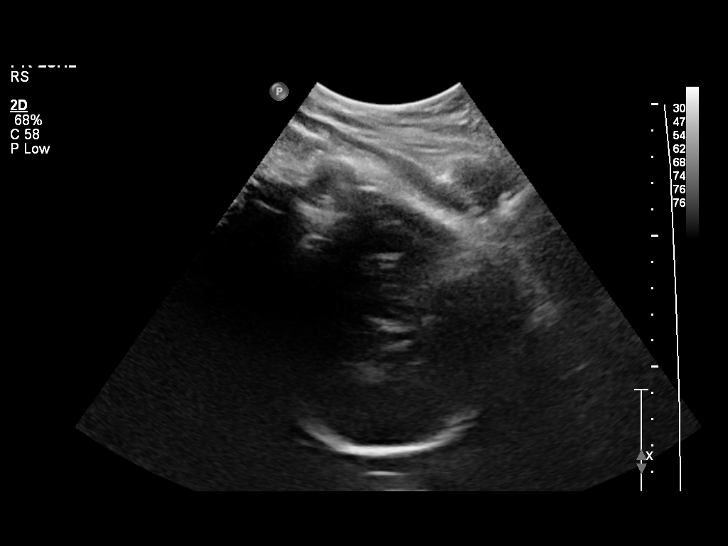
[im 9/10]
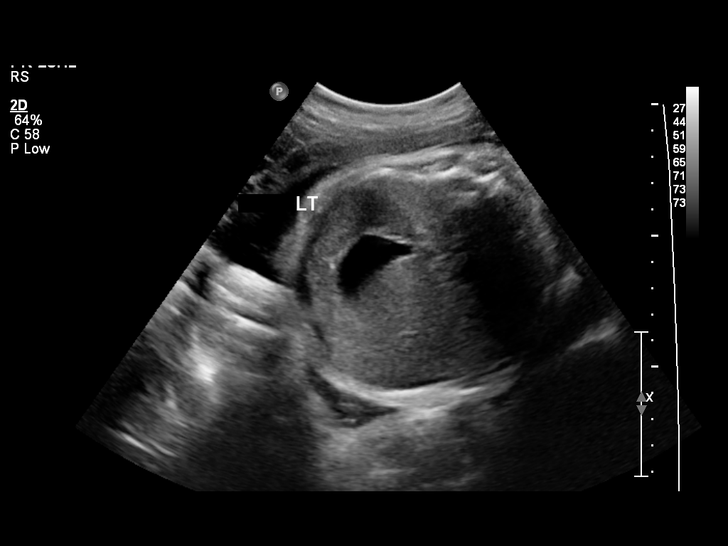
[im 10/10]
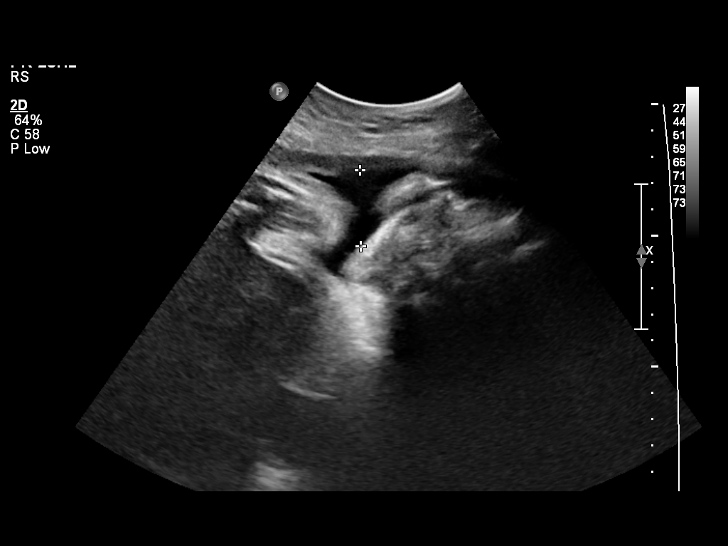

[10 of 10 positions shown; findings below may reference images not displayed]

OBSTETRICS REPORT
(Signed Final 06/13/2015 [DATE])

Service(s) Provided

[HOSPITAL]                                         76815.0
Indications

37 weeks gestation of pregnancy
Determine fetal presentation using ultrasound         Z36
Twin
Fetal Evaluation (Fetus A)

Num Of Fetuses:    2
Fetal Heart Rate:  152                          bpm
Cardiac Activity:  Observed
Presentation:      Cephalic
Gestational Age (Fetus A)

Clinical EDD:  37w 1d                                        EDD:   07/03/15
Best:          37w 1d     Det. By:  Clinical EDD             EDD:   07/03/15

Fetal Evaluation (Fetus B)

Num Of Fetuses:    2
Fetal Heart Rate:  150                          bpm
Cardiac Activity:  Observed
Presentation:      Breech
Gestational Age (Fetus B)

Clinical EDD:  37w 1d                                        EDD:   07/03/15
Best:          37w 1d     Det. By:  Clinical EDD             EDD:   07/03/15
Impression

Twin gestation at 37w 1d
Limited ultrasound performed to determine fetal presentation

Twin A (presenting twin) - cephalic
Normal amniotic fluid volume (MVP 2.89 cm)

Twin B: Breech
Normal amniotic fluid volume (MVP 3.8 cm)
Recommendations

Follow-up ultrasounds as clinically indicated.

questions or concerns.

## 2017-02-09 LAB — OB RESULTS CONSOLE RPR: RPR: NONREACTIVE

## 2017-02-09 LAB — OB RESULTS CONSOLE ABO/RH: RH Type: POSITIVE

## 2017-02-09 LAB — OB RESULTS CONSOLE GC/CHLAMYDIA
Chlamydia: NEGATIVE
GC PROBE AMP, GENITAL: NEGATIVE

## 2017-02-09 LAB — OB RESULTS CONSOLE HEPATITIS B SURFACE ANTIGEN: Hepatitis B Surface Ag: NEGATIVE

## 2017-02-09 LAB — OB RESULTS CONSOLE ANTIBODY SCREEN: Antibody Screen: NEGATIVE

## 2017-02-09 LAB — OB RESULTS CONSOLE HIV ANTIBODY (ROUTINE TESTING): HIV: NONREACTIVE

## 2017-02-09 LAB — OB RESULTS CONSOLE RUBELLA ANTIBODY, IGM: Rubella: IMMUNE

## 2017-08-04 LAB — OB RESULTS CONSOLE GBS: GBS: NEGATIVE

## 2017-08-29 ENCOUNTER — Inpatient Hospital Stay (HOSPITAL_COMMUNITY): Payer: Federal, State, Local not specified - PPO | Admitting: Anesthesiology

## 2017-08-29 ENCOUNTER — Encounter (HOSPITAL_COMMUNITY): Payer: Self-pay | Admitting: *Deleted

## 2017-08-29 ENCOUNTER — Inpatient Hospital Stay (HOSPITAL_COMMUNITY)
Admission: AD | Admit: 2017-08-29 | Discharge: 2017-08-31 | DRG: 775 | Disposition: A | Discharge status: Home / Self Care | Payer: Federal, State, Local not specified - PPO | Source: Ambulatory Visit | Attending: Obstetrics and Gynecology | Admitting: Obstetrics and Gynecology

## 2017-08-29 DIAGNOSIS — O9902 Anemia complicating childbirth: Secondary | ICD-10-CM | POA: Diagnosis present

## 2017-08-29 DIAGNOSIS — O34211 Maternal care for low transverse scar from previous cesarean delivery: Principal | ICD-10-CM | POA: Diagnosis present

## 2017-08-29 DIAGNOSIS — Z3A38 38 weeks gestation of pregnancy: Secondary | ICD-10-CM

## 2017-08-29 DIAGNOSIS — O26893 Other specified pregnancy related conditions, third trimester: Secondary | ICD-10-CM | POA: Diagnosis present

## 2017-08-29 DIAGNOSIS — D649 Anemia, unspecified: Secondary | ICD-10-CM | POA: Diagnosis present

## 2017-08-29 LAB — CBC
HCT: 34.9 % — ABNORMAL LOW (ref 36.0–46.0)
HEMOGLOBIN: 12 g/dL (ref 12.0–15.0)
MCH: 30.5 pg (ref 26.0–34.0)
MCHC: 34.4 g/dL (ref 30.0–36.0)
MCV: 88.8 fL (ref 78.0–100.0)
PLATELETS: 241 10*3/uL (ref 150–400)
RBC: 3.93 MIL/uL (ref 3.87–5.11)
RDW: 13 % (ref 11.5–15.5)
WBC: 13.4 10*3/uL — ABNORMAL HIGH (ref 4.0–10.5)

## 2017-08-29 LAB — POCT FERN TEST

## 2017-08-29 LAB — TYPE AND SCREEN
ABO/RH(D): A POS
ANTIBODY SCREEN: NEGATIVE

## 2017-08-29 MED ORDER — ACETAMINOPHEN 325 MG PO TABS: 650.0000 mg | ORAL_TABLET | ORAL | Status: DC | PRN

## 2017-08-29 MED ORDER — EPHEDRINE 5 MG/ML INJ: 10.0000 mg | INTRAVENOUS | Status: DC | PRN

## 2017-08-29 MED ORDER — OXYCODONE-ACETAMINOPHEN 5-325 MG PO TABS: 2.0000 | ORAL_TABLET | ORAL | Status: DC | PRN

## 2017-08-29 MED ORDER — OXYTOCIN BOLUS FROM INFUSION
500.0000 mL | Freq: Once | INTRAVENOUS | Status: AC
  Administered 2017-08-30: 500 mL via INTRAVENOUS

## 2017-08-29 MED ORDER — EPHEDRINE 5 MG/ML INJ
10.0000 mg | INTRAVENOUS | Status: DC | PRN
  Filled 2017-08-29: qty 2

## 2017-08-29 MED ORDER — PHENYLEPHRINE 40 MCG/ML (10ML) SYRINGE FOR IV PUSH (FOR BLOOD PRESSURE SUPPORT)
80.0000 ug | PREFILLED_SYRINGE | INTRAVENOUS | Status: DC | PRN
  Filled 2017-08-29: qty 10
  Filled 2017-08-29: qty 5

## 2017-08-29 MED ORDER — DIPHENHYDRAMINE HCL 50 MG/ML IJ SOLN: 12.5000 mg | INTRAMUSCULAR | Status: DC | PRN

## 2017-08-29 MED ORDER — PHENYLEPHRINE 40 MCG/ML (10ML) SYRINGE FOR IV PUSH (FOR BLOOD PRESSURE SUPPORT)
80.0000 ug | PREFILLED_SYRINGE | INTRAVENOUS | Status: DC | PRN
  Filled 2017-08-29: qty 5

## 2017-08-29 MED ORDER — SOD CITRATE-CITRIC ACID 500-334 MG/5ML PO SOLN: 30.0000 mL | ORAL | Status: DC | PRN

## 2017-08-29 MED ORDER — LIDOCAINE HCL (PF) 1 % IJ SOLN
30.0000 mL | INTRAMUSCULAR | Status: DC | PRN
  Administered 2017-08-30: 30 mL via SUBCUTANEOUS
  Filled 2017-08-29: qty 30

## 2017-08-29 MED ORDER — OXYCODONE-ACETAMINOPHEN 5-325 MG PO TABS: 1.0000 | ORAL_TABLET | ORAL | Status: DC | PRN

## 2017-08-29 MED ORDER — OXYTOCIN 40 UNITS IN LACTATED RINGERS INFUSION - SIMPLE MED
2.5000 [IU]/h | INTRAVENOUS | Status: DC
  Filled 2017-08-29: qty 1000

## 2017-08-29 MED ORDER — FENTANYL 2.5 MCG/ML BUPIVACAINE 1/10 % EPIDURAL INFUSION (WH - ANES)
14.0000 mL/h | INTRAMUSCULAR | Status: DC | PRN
  Administered 2017-08-29: 14 mL/h via EPIDURAL
  Filled 2017-08-29: qty 100

## 2017-08-29 MED ORDER — LACTATED RINGERS IV SOLN: 500.0000 mL | Freq: Once | INTRAVENOUS | Status: DC

## 2017-08-29 MED ORDER — ONDANSETRON HCL 4 MG/2ML IJ SOLN: 4.0000 mg | Freq: Four times a day (QID) | INTRAMUSCULAR | Status: DC | PRN

## 2017-08-29 MED ORDER — LACTATED RINGERS IV SOLN
500.0000 mL | INTRAVENOUS | Status: DC | PRN
Start: 2017-08-29 — End: 2017-08-30

## 2017-08-29 MED ORDER — LACTATED RINGERS IV SOLN
INTRAVENOUS | Status: DC
  Administered 2017-08-29: 23:00:00 via INTRAVENOUS

## 2017-08-29 MED ORDER — FLEET ENEMA 7-19 GM/118ML RE ENEM: 1.0000 | ENEMA | RECTAL | Status: DC | PRN

## 2017-08-29 MED ORDER — LIDOCAINE HCL (PF) 1 % IJ SOLN
INTRAMUSCULAR | Status: DC | PRN
  Administered 2017-08-29: 4 mL

## 2017-08-29 MED ORDER — PHENYLEPHRINE 40 MCG/ML (10ML) SYRINGE FOR IV PUSH (FOR BLOOD PRESSURE SUPPORT): 80.0000 ug | PREFILLED_SYRINGE | INTRAVENOUS | Status: DC | PRN

## 2017-08-29 NOTE — MAU Provider Note (Signed)
S: Ms. Kristen Patterson is a 21 y.o. G3P2003 at [redacted]w[redacted]d  who presents to MAU today complaining of leaking of fluid since 5 pm. She denies vaginal bleeding. She endorses contractions. She reports normal fetal movement.    O: BP 135/85   Pulse 91   Temp 98.3 F (36.8 C) (Oral)   Resp 18   Ht  (1.753 m)   Wt 241 lb (109.3 kg)   SpO2 98%   BMI 35.59 kg/m  GENERAL: Well-developed, well-nourished female in no acute distress.  HEAD: Normocephalic, atraumatic.  CHEST: Normal effort of breathing, regular heart rate ABDOMEN: Soft, nontender, gravid PELVIC: Normal external female genitalia. Vagina is pink and rugated. Cervix with normal contour, no lesions. Normal discharge.  Negative pooling.   Cervical exam:  Dilation: 5 Effacement (%): 80 Station: -2 Presentation: Vertex Exam by:: Timberlynn Kizziah leftwhich kirby cnm   FHR baseline 145 with moderate variability, positive accels, questionable variables vs marked variability when EFM first applied but resolved with position change Toco with contractions every 2-3 minutes, moderate to palpation  No results found for this or any previous visit (from the past 24 hour(s)).   A: SIUP at [redacted]w[redacted]d  Membranes intact  Active labor with cervical chang from 3cm in office to 5 cm in MAU with increased contractions  P: Report given to RN to contact MD on call for further instructions  Hurshel Party, CNM 08/29/2017 10:30 PM

## 2017-08-29 NOTE — MAU Note (Signed)
PT  SAYS UC  STRONG  SINCE 1 PM.    IN OFFICE   3  CM.

## 2017-08-29 NOTE — Anesthesia Procedure Notes (Signed)
Epidural Patient location during procedure: OB Start time: 08/29/2017 11:23 PM End time: 08/29/2017 11:30 PM  Staffing Anesthesiologist: Shona Simpson D Performed: anesthesiologist   Preanesthetic Checklist Completed: patient identified, site marked, surgical consent, pre-op evaluation, timeout performed, IV checked, risks and benefits discussed and monitors and equipment checked  Epidural Patient position: sitting Prep: ChloraPrep Patient monitoring: heart rate, continuous pulse ox and blood pressure Approach: midline Location: L3-L4 Injection technique: LOR saline  Needle:  Needle type: Tuohy  Needle gauge: 17 G Needle length: 9 cm Catheter type: closed end flexible Catheter size: 20 Guage Test dose: negative and 1.5% lidocaine  Assessment Events: blood not aspirated, injection not painful, no injection resistance and no paresthesia  Additional Notes LOR @ 5  Patient identified. Risks/Benefits/Options discussed with patient including but not limited to bleeding, infection, nerve damage, paralysis, failed block, incomplete pain control, headache, blood pressure changes, nausea, vomiting, reactions to medications, itching and postpartum back pain. Confirmed with bedside nurse the patient's most recent platelet count. Confirmed with patient that they are not currently taking any anticoagulation, have any bleeding history or any family history of bleeding disorders. Patient expressed understanding and wished to proceed. All questions were answered. Sterile technique was used throughout the entire procedure. Please see nursing notes for vital signs. Test dose was given through epidural catheter and negative prior to continuing to dose epidural or start infusion. Warning signs of high block given to the patient including shortness of breath, tingling/numbness in hands, complete motor block, or any concerning symptoms with instructions to call for help. Patient was given instructions on  fall risk and not to get out of bed. All questions and concerns addressed with instructions to call with any issues or inadequate analgesia.    Reason for block:procedure for pain

## 2017-08-29 NOTE — Anesthesia Preprocedure Evaluation (Signed)
Anesthesia Evaluation  Patient identified by MRN, date of birth, ID band Patient awake    Reviewed: Allergy & Precautions, Patient's Chart, lab work & pertinent test results  Airway Mallampati: II       Dental  (+) Teeth Intact   Pulmonary    Pulmonary exam normal        Cardiovascular Normal cardiovascular exam     Neuro/Psych    GI/Hepatic   Endo/Other    Renal/GU      Musculoskeletal   Abdominal   Peds  Hematology  (+) anemia ,   Anesthesia Other Findings   Reproductive/Obstetrics (+) Pregnancy                             Lab Results  Component Value Date   WBC 13.4 (H) 08/29/2017   HGB 12.0 08/29/2017   HCT 34.9 (L) 08/29/2017   MCV 88.8 08/29/2017   PLT 241 08/29/2017   No results found for: INR, PROTIME   Anesthesia Physical Anesthesia Plan  ASA: II  Anesthesia Plan: Epidural   Post-op Pain Management:    Induction:   PONV Risk Score and Plan:   Airway Management Planned:   Additional Equipment:   Intra-op Plan:   Post-operative Plan:   Informed Consent: I have reviewed the patients History and Physical, chart, labs and discussed the procedure including the risks, benefits and alternatives for the proposed anesthesia with the patient or authorized representative who has indicated his/her understanding and acceptance.     Plan Discussed with:   Anesthesia Plan Comments:         Anesthesia Quick Evaluation

## 2017-08-29 NOTE — MAU Note (Signed)
Urine in the lab  

## 2017-08-30 ENCOUNTER — Encounter (HOSPITAL_COMMUNITY): Payer: Self-pay | Admitting: Obstetrics and Gynecology

## 2017-08-30 LAB — CBC
HCT: 32.3 % — ABNORMAL LOW (ref 36.0–46.0)
Hemoglobin: 11.1 g/dL — ABNORMAL LOW (ref 12.0–15.0)
MCH: 30.6 pg (ref 26.0–34.0)
MCHC: 34.4 g/dL (ref 30.0–36.0)
MCV: 89 fL (ref 78.0–100.0)
PLATELETS: 222 10*3/uL (ref 150–400)
RBC: 3.63 MIL/uL — ABNORMAL LOW (ref 3.87–5.11)
RDW: 13.1 % (ref 11.5–15.5)
WBC: 14.4 10*3/uL — ABNORMAL HIGH (ref 4.0–10.5)

## 2017-08-30 LAB — RPR: RPR: NONREACTIVE

## 2017-08-30 MED ORDER — SIMETHICONE 80 MG PO CHEW: 80.0000 mg | CHEWABLE_TABLET | ORAL | Status: DC | PRN

## 2017-08-30 MED ORDER — ONDANSETRON HCL 4 MG/2ML IJ SOLN: 4.0000 mg | INTRAMUSCULAR | Status: DC | PRN

## 2017-08-30 MED ORDER — OXYCODONE HCL 5 MG PO TABS
10.0000 mg | ORAL_TABLET | ORAL | Status: DC | PRN
Start: 2017-08-30 — End: 2017-08-31
  Filled 2017-08-30: qty 2

## 2017-08-30 MED ORDER — BENZOCAINE-MENTHOL 20-0.5 % EX AERO: 1.0000 "application " | INHALATION_SPRAY | CUTANEOUS | Status: DC | PRN

## 2017-08-30 MED ORDER — DIBUCAINE 1 % RE OINT: 1.0000 "application " | TOPICAL_OINTMENT | RECTAL | Status: DC | PRN

## 2017-08-30 MED ORDER — ZOLPIDEM TARTRATE 5 MG PO TABS: 5.0000 mg | ORAL_TABLET | Freq: Every evening | ORAL | Status: DC | PRN

## 2017-08-30 MED ORDER — OXYCODONE HCL 5 MG PO TABS
5.0000 mg | ORAL_TABLET | ORAL | Status: DC | PRN
  Administered 2017-08-30 – 2017-08-31 (×3): 5 mg via ORAL
  Filled 2017-08-30 (×2): qty 1

## 2017-08-30 MED ORDER — IBUPROFEN 600 MG PO TABS
600.0000 mg | ORAL_TABLET | Freq: Four times a day (QID) | ORAL | Status: DC
  Administered 2017-08-30 – 2017-08-31 (×5): 600 mg via ORAL
  Filled 2017-08-30 (×5): qty 1

## 2017-08-30 MED ORDER — INFLUENZA VAC SPLIT QUAD 0.5 ML IM SUSY: 0.5000 mL | PREFILLED_SYRINGE | INTRAMUSCULAR | Status: DC

## 2017-08-30 MED ORDER — COCONUT OIL OIL: 1.0000 "application " | TOPICAL_OIL | Status: DC | PRN

## 2017-08-30 MED ORDER — SENNOSIDES-DOCUSATE SODIUM 8.6-50 MG PO TABS
2.0000 | ORAL_TABLET | ORAL | Status: DC
  Administered 2017-08-30: 2 via ORAL
  Filled 2017-08-30: qty 2

## 2017-08-30 MED ORDER — ONDANSETRON HCL 4 MG PO TABS: 4.0000 mg | ORAL_TABLET | ORAL | Status: DC | PRN

## 2017-08-30 MED ORDER — PRENATAL MULTIVITAMIN CH
1.0000 | ORAL_TABLET | Freq: Every day | ORAL | Status: DC
  Administered 2017-08-30: 1 via ORAL
  Filled 2017-08-30: qty 1

## 2017-08-30 MED ORDER — DIPHENHYDRAMINE HCL 25 MG PO CAPS: 25.0000 mg | ORAL_CAPSULE | Freq: Four times a day (QID) | ORAL | Status: DC | PRN

## 2017-08-30 MED ORDER — WITCH HAZEL-GLYCERIN EX PADS: 1.0000 "application " | MEDICATED_PAD | CUTANEOUS | Status: DC | PRN

## 2017-08-30 MED ORDER — LACTATED RINGERS IV SOLN: INTRAVENOUS | Status: DC

## 2017-08-30 MED ORDER — ACETAMINOPHEN 325 MG PO TABS: 650.0000 mg | ORAL_TABLET | ORAL | Status: DC | PRN

## 2017-08-30 NOTE — Anesthesia Postprocedure Evaluation (Signed)
Anesthesia Post Note  Patient: Kristen Patterson  Procedure(s) Performed: * No procedures listed *     Patient location during evaluation: Mother Baby Anesthesia Type: Epidural Level of consciousness: awake, awake and alert, oriented and patient cooperative Pain management: pain level controlled Vital Signs Assessment: post-procedure vital signs reviewed and stable Respiratory status: spontaneous breathing, nonlabored ventilation and respiratory function stable Cardiovascular status: stable Postop Assessment: no headache, no backache, epidural receding, patient able to bend at knees and no apparent nausea or vomiting Anesthetic complications: no    Last Vitals:  Vitals:   08/30/17 0455 08/30/17 0555  BP: 128/64 130/70  Pulse: 86 85  Resp: 18 18  Temp: 36.9 C 36.9 C  SpO2:      Last Pain:  Vitals:   08/30/17 0555  TempSrc: Oral  PainSc: 0-No pain   Pain Goal:                 [name]

## 2017-08-30 NOTE — Progress Notes (Signed)
Patient ID: Kristen Patterson, female   DOB: 06-01-1996, 21 y.o.   MRN: [id number] DOD  Feels fine.  Bleeding slowing down and normal. No pain Bottlefeeding and no issues

## 2017-08-30 NOTE — H&P (Signed)
Kristen Patterson is a 21 y.o. female G3P2003 at 38+ with contractions increasing in intensity and frequency - cervical change in MAU from 3cm to 5cm.  Relatively uncomplicated prenatal care except Low-lying placenta resolved on repeat US.  Had short interpregnancy interval   OB History    Gravida Para Term Preterm AB Living   0 0 3   SAB TAB Ectopic Multiple Live Births   0 0 0 1 3    G1 06/14/15 twin SVD - 6#5, female, VAVD  SVD 5#12, female SVD G2 06/17/16 SVD female 8#15 G3 present  No abn pap, last 8/16 No STDs  Past Medical History:  Diagnosis Date  . Anemia    on Iron at home  . Chronic UTI    as child  asthma  Past Surgical History:  Procedure Laterality Date  . ANTERIOR CRUCIATE LIGAMENT REPAIR    . CESAREAN SECTION MULTI-GESTATIONAL N/A 06/14/2015   Procedure: MULTI-GESTATIONAL VAGINAL DELIVERY, POSSIBLE CESAREAN SECTION;  Surgeon: Lavina Hamman, MD;  Location: [facility];  Service: Obstetrics;  Laterality: N/A;  . CYSTOSTOMY W/ BLADDER DILATION    . WISDOM TOOTH EXTRACTION     Family History: HTN, DM Social History:  reports that she has never smoked. She has never used smokeless tobacco. She reports that she does not drink alcohol or use drugs. SAHM, married  Meds PNV All NKDA     Maternal Diabetes: No Genetic Screening: Normal Maternal Ultrasounds/Referrals: Normal Fetal Ultrasounds or other Referrals:  None Maternal Substance Abuse:  No Significant Maternal Medications:  None Significant Maternal Lab Results:  Lab values include: Group B Strep negative Other Comments:  None  Review of Systems  Constitutional: Negative.   HENT: Negative.   Eyes: Negative.   Respiratory: Negative.   Cardiovascular: Negative.   Gastrointestinal: Negative.   Genitourinary: Negative.   Musculoskeletal: Positive for back pain.  Skin: Negative.   Neurological: Negative.   Psychiatric/Behavioral: Negative.    Maternal Medical History:  Reason for admission: Contractions.    Contractions: Frequency: regular.   Perceived severity is moderate.    Fetal activity: Perceived fetal activity is normal.    Prenatal Complications - Diabetes: none.    Dilation: 7.5 Effacement (%): 90 Station: -1 Exam by:: [employer] RN Blood pressure (!) 117/51, pulse 83, temperature 98.6 F (37 C), temperature source Oral, resp. rate 18, height  (1.753 m), weight 109.3 kg (241 lb), SpO2 99 %, unknown if currently breastfeeding. Maternal Exam:  Uterine Assessment: Contraction frequency is regular.   Abdomen: Patient reports no abdominal tenderness. Fundal height is appropriate for gestation.   Estimated fetal weight is 8.5-9#.   Fetal presentation: vertex  Introitus: Normal vulva. Normal vagina.  Cervix: Cervix evaluated by digital exam.     Physical Exam  Constitutional: She is oriented to person, place, and time. She appears well-developed and well-nourished.  HENT:  Head: Normocephalic and atraumatic.  Cardiovascular: Normal rate and regular rhythm.   Respiratory: Effort normal and breath sounds normal. No respiratory distress. She has no wheezes.  GI: Soft. Bowel sounds are normal. She exhibits no distension. There is no tenderness.  Musculoskeletal: Normal range of motion.  Neurological: She is alert and oriented to person, place, and time.  Skin: Skin is warm and dry.  Psychiatric: She has a normal mood and affect. Her behavior is normal.    Prenatal labs: ABO, Rh: --/--/A POS (09/24 2247) Antibody: NEG (09/24 2247) Rubella: Immune (03/07 0000) RPR: Nonreactive (03/07 0000)  HBsAg: Negative (03/07 0000)  HIV: Non-reactive (03/07 0000)  GBS: Negative (08/30 0000)   Hgb 13.3/Plt 261/GC neg/Chl neg/CF neg/SMA neg/Fragile X neg/First trimester screen WNL, nl NT; glucola 134  LLP resolved  Anatomy - nl anat, low-lying placenta - post; female EDC by LMP cw early [place]  Tdap 06/15/17  Assessment/Plan: 21yo G3P2003 at 38+ in active labor - cervical  change in MAU gbbs neg - no prophylaxis Epidural for pain Expect SVD  September Mormile Bovard-Stuckert 08/30/2017, 2:23 AM

## 2017-08-30 NOTE — Progress Notes (Signed)
Patient ID: Kristen Patterson, female   DOB: 07/23/1996, 21 y.o.   MRN: [id number]   Pt comfortable with epidural, some pressure  AFVSS gen NAD FHTs 140's, mod var, + accels, some early decels, some late decels, category 2; after ROM some variable decels toco q 2-83min  AROM for clear fluid w/o difficulty or complication  9/90/0-+1  Expect SVD soon.

## 2017-08-31 ENCOUNTER — Inpatient Hospital Stay (HOSPITAL_COMMUNITY): Payer: Federal, State, Local not specified - PPO

## 2017-08-31 MED ORDER — IBUPROFEN 600 MG PO TABS: 600.0000 mg | ORAL_TABLET | Freq: Four times a day (QID) | ORAL | 0 refills | Status: DC

## 2017-08-31 NOTE — Progress Notes (Signed)
PPD #1 No problems, wants to go home Afeb, VSS Fundus firm, NT at U-1 Continue routine postpartum care, d/c home 

## 2017-08-31 NOTE — Discharge Instructions (Signed)
As per discharge pamphlet °

## 2017-08-31 NOTE — Discharge Summary (Signed)
OB Discharge Summary     Patient Name: Enyla Lisbon DOB: 11-13-1996 MRN: [id number]  Date of admission: 08/29/2017 Delivering MD: Sherian Rein   Date of discharge: 08/31/2017  Admitting diagnosis: 38wks CTX Intrauterine pregnancy: [redacted]w[redacted]d     Secondary diagnosis:  Principal Problem:   SVD (spontaneous vaginal delivery) Active Problems:   Indication for care in labor or delivery      Discharge diagnosis: Term Pregnancy Delivered                                  Hospital course:  Onset of Labor With Vaginal Delivery     21 y.o. yo [id number] at [redacted]w[redacted]d was admitted in Active Labor on 08/29/2017. Patient had an uncomplicated labor course as follows:  Membrane Rupture Time/Date: 2:39 AM ,08/30/2017   Intrapartum Procedures: Episiotomy: None [1]                                         Lacerations:  Labial [10];2nd degree [3]  Patient had a delivery of a Viable infant. 08/30/2017  Information for the patient's newborn:  Britania, Shreeve Girl Takyah [[id number]]  Delivery Method: Vag-Spont    Pateint had an uncomplicated postpartum course.  She is ambulating, tolerating a regular diet, passing flatus, and urinating well. Patient is discharged home in stable condition on 08/31/17.   Physical exam  Vitals:   08/30/17 0455 08/30/17 0555 08/30/17 1745 08/31/17 0522  BP: 128/64 130/70 121/68 120/64  Pulse: 86 85 80 71  Resp: Temp: 98.5 F (36.9 C) 98.5 F (36.9 C) 97.6 F (36.4 C) 97.8 F (36.6 C)  TempSrc: Oral Oral Oral Oral  SpO2:      Weight:      Height:       General: alert Lochia: appropriate Uterine Fundus: firm  Labs: Lab Results  Component Value Date   WBC 14.4 (H) 08/30/2017   HGB 11.1 (L) 08/30/2017   HCT 32.3 (L) 08/30/2017   MCV 89.0 08/30/2017   PLT 222 08/30/2017   CMP Latest Ref Rng & Units 06/13/2015  Glucose 65 - 99 mg/dL 83  BUN 6 - 20 mg/dL 7  Creatinine 6.96 - 2.95 mg/dL 2.84  Sodium 132 - 440 mmol/L 132(L)  Potassium 3.5 - 5.1  mmol/L 3.9  Chloride 101 - 111 mmol/L 108  CO2 22 - 32 mmol/L 18(L)  Calcium 8.9 - 10.3 mg/dL 1.0(U)  Total Protein 6.5 - 8.1 g/dL 7.2(Z)  Total Bilirubin 0.3 - 1.2 mg/dL 0.6  Alkaline Phos 38 - 126 U/L 231(H)  AST 15 - 41 U/L 21  ALT 14 - 54 U/L 11(L)    Discharge instruction: per After Visit Summary and "Baby and Me Booklet".  After visit meds:  Allergies as of 08/31/2017   No Known Allergies     Medication List    TAKE these medications   ibuprofen 600 MG tablet Commonly known as:  ADVIL,MOTRIN Take 1 tablet (600 mg total) by mouth every 6 (six) hours.   prenatal multivitamin Tabs tablet Take 1 tablet by mouth daily at 12 noon.            Discharge Care Instructions        Start     Ordered   08/31/17 0000  ibuprofen (ADVIL,MOTRIN) 600 MG tablet  Every 6 hours     08/31/17 0818   08/31/17 0000  Diet - low sodium heart healthy     08/31/17 0818   08/29/17 0000  OB RESULT CONSOLE Group B Strep    Comments:  This external order was created through the Results Console.   08/29/17 2240   08/29/17 0000  OB RESULTS CONSOLE GC/Chlamydia    Comments:  This external order was created through the Results Console.   08/29/17 2240   08/29/17 0000  OB RESULTS CONSOLE RPR    Comments:  This external order was created through the Results Console.    08/29/17 2240   08/29/17 0000  OB RESULTS CONSOLE HIV antibody    Comments:  This external order was created through the Results Console.    08/29/17 2240   08/29/17 0000  OB RESULTS CONSOLE Rubella Antibody    Comments:  This external order was created through the Results Console.    08/29/17 2240   08/29/17 0000  OB RESULTS CONSOLE Hepatitis B surface antigen    Comments:  This external order was created through the Results Console.    08/29/17 2240   08/29/17 0000  OB RESULTS CONSOLE ABO/Rh    Comments:  This external order was created through the Results Console.    08/29/17 2240   08/29/17 0000  OB RESULTS  CONSOLE Antibody Screen    Comments:  This external order was created through the Results Console.    08/29/17 2240      Diet: routine diet  Activity: Advance as tolerated. Pelvic rest for 6 weeks.   Outpatient follow up:3 weeks   Newborn Data: Live born female  Birth Weight: 8 lb 6.2 oz (3805 g) APGAR: 8, 9  Baby Feeding: Breast Disposition:home with mother   08/31/2017 Zenaida Niece, MD

## 2017-12-06 NOTE — L&D Delivery Note (Signed)
Delivery Note At 4:54 AM a viable female was delivered via Vaginal, Spontaneous (Presentation: vtx; LOA ).  APGAR: 9, 9; weight pending.   Placenta status: spontaneous, intact.  Cord:  with the following complications: none.   Anesthesia:  Epidural, local Episiotomy: None Lacerations: 1st degree Suture Repair: 3.0 vicryl rapide Est. Blood Loss (mL):  200  Mom to postpartum.  Baby to Couplet care / Skin to Skin. Will do circumcision in the office . Leighton Roachodd D Kelia Gibbon 11/27/2018, 5:11 AM

## 2018-05-05 LAB — HM PAP SMEAR: HM Pap smear: NORMAL

## 2018-11-04 ENCOUNTER — Encounter (HOSPITAL_COMMUNITY): Payer: Self-pay | Admitting: *Deleted

## 2018-11-04 ENCOUNTER — Inpatient Hospital Stay (HOSPITAL_COMMUNITY)
Admission: AD | Admit: 2018-11-04 | Discharge: 2018-11-04 | Disposition: A | Discharge status: Home / Self Care | Payer: Federal, State, Local not specified - PPO | Source: Ambulatory Visit | Attending: Obstetrics and Gynecology | Admitting: Obstetrics and Gynecology

## 2018-11-04 DIAGNOSIS — O219 Vomiting of pregnancy, unspecified: Secondary | ICD-10-CM | POA: Diagnosis present

## 2018-11-04 LAB — URINALYSIS, ROUTINE W REFLEX MICROSCOPIC
BILIRUBIN URINE: NEGATIVE
Glucose, UA: NEGATIVE mg/dL
Hgb urine dipstick: NEGATIVE
Ketones, ur: 80 mg/dL — AB
Nitrite: NEGATIVE
Protein, ur: 30 mg/dL — AB
SPECIFIC GRAVITY, URINE: 1.028 (ref 1.005–1.030)
pH: 6 (ref 5.0–8.0)

## 2018-11-04 LAB — CBC
HEMATOCRIT: 35.8 % — AB (ref 36.0–46.0)
Hemoglobin: 11.7 g/dL — ABNORMAL LOW (ref 12.0–15.0)
MCH: 28.9 pg (ref 26.0–34.0)
MCHC: 32.7 g/dL (ref 30.0–36.0)
MCV: 88.4 fL (ref 80.0–100.0)
NRBC: 0 % (ref 0.0–0.2)
Platelets: 255 10*3/uL (ref 150–400)
RBC: 4.05 MIL/uL (ref 3.87–5.11)
RDW: 13.4 % (ref 11.5–15.5)
WBC: 7.6 10*3/uL (ref 4.0–10.5)

## 2018-11-04 LAB — COMPREHENSIVE METABOLIC PANEL
ALBUMIN: 2.8 g/dL — AB (ref 3.5–5.0)
ALT: 9 U/L (ref 0–44)
AST: 22 U/L (ref 15–41)
Alkaline Phosphatase: 156 U/L — ABNORMAL HIGH (ref 38–126)
Anion gap: 13 (ref 5–15)
BILIRUBIN TOTAL: 1.2 mg/dL (ref 0.3–1.2)
BUN: 9 mg/dL (ref 6–20)
CHLORIDE: 103 mmol/L (ref 98–111)
CO2: 16 mmol/L — AB (ref 22–32)
Calcium: 8.1 mg/dL — ABNORMAL LOW (ref 8.9–10.3)
Creatinine, Ser: 0.45 mg/dL (ref 0.44–1.00)
GFR calc Af Amer: >60 mL/min (ref >60)
GFR calc non Af Amer: >60 mL/min (ref >60)
GLUCOSE: 94 mg/dL (ref 70–99)
POTASSIUM: 4.2 mmol/L (ref 3.5–5.1)
SODIUM: 132 mmol/L — AB (ref 135–145)
TOTAL PROTEIN: 6.3 g/dL — AB (ref 6.5–8.1)

## 2018-11-04 MED ORDER — ONDANSETRON HCL 4 MG/2ML IJ SOLN
4.0000 mg | Freq: Once | INTRAMUSCULAR | Status: AC
  Administered 2018-11-04: 4 mg via INTRAVENOUS
  Filled 2018-11-04: qty 2

## 2018-11-04 MED ORDER — ACETAMINOPHEN 500 MG PO TABS
1000.0000 mg | ORAL_TABLET | Freq: Once | ORAL | Status: AC
  Administered 2018-11-04: 1000 mg via ORAL
  Filled 2018-11-04: qty 2

## 2018-11-04 MED ORDER — ONDANSETRON 4 MG PO TBDP: 4.0000 mg | ORAL_TABLET | Freq: Three times a day (TID) | ORAL | 0 refills | Status: DC | PRN

## 2018-11-04 MED ORDER — LACTATED RINGERS IV BOLUS
1000.0000 mL | Freq: Once | INTRAVENOUS | Status: AC
  Administered 2018-11-04: 1000 mL via INTRAVENOUS

## 2018-11-04 NOTE — MAU Note (Signed)
Pt stated she started feeling nauseated this morning and has vomited several times and now is just dry heaving. not able to keep anything down.

## 2018-11-04 NOTE — Discharge Instructions (Signed)
Nausea and Vomiting, Adult Feeling sick to your stomach (nausea) means that your stomach is upset or you feel like you have to throw up (vomit). Feeling more and more sick to your stomach can lead to throwing up. Throwing up happens when food and liquid from your stomach are thrown up and out the mouth. Throwing up can make you feel weak and cause you to get dehydrated. Dehydration can make you tired and thirsty, make you have a dry mouth, and make it so you pee (urinate) less often. Older adults and people with other diseases or a weak defense system (immune system) are at higher risk for dehydration. If you feel sick to your stomach or if you throw up, it is important to follow instructions from your doctor about how to take care of yourself. Follow these instructions at home: Eating and drinking Follow these instructions as told by your doctor:  Take an oral rehydration solution (ORS). This is a drink that is sold at pharmacies and stores.  Drink clear fluids in small amounts as you are able, such as: ? Water. ? Ice chips. ? Diluted fruit juice. ? Low-calorie sports drinks.  Eat bland, easy-to-digest foods in small amounts as you are able, such as: ? Bananas. ? Applesauce. ? Rice. ? Low-fat (lean) meats. ? Toast. ? Crackers.  Avoid fluids that have a lot of sugar or caffeine in them.  Avoid alcohol.  Avoid spicy or fatty foods.  General instructions  Drink enough fluid to keep your pee (urine) clear or pale yellow.  Wash your hands often. If you cannot use soap and water, use hand sanitizer.  Make sure that all people in your home wash their hands well and often.  Take over-the-counter and prescription medicines only as told by your doctor.  Rest at home while you get better.  Watch your condition for any changes.  Breathe slowly and deeply when you feel sick to your stomach.  Keep all follow-up visits as told by your doctor. This is important. Contact a doctor  if:  You have a fever.  You cannot keep fluids down.  Your symptoms get worse.  You have new symptoms.  You feel sick to your stomach for more than two days.  You feel light-headed or dizzy.  You have a headache.  You have muscle cramps. Get help right away if:  You have pain in your chest, neck, arm, or jaw.  You feel very weak or you pass out (faint).  You throw up again and again.  You see blood in your throw-up.  Your throw-up looks like black coffee grounds.  You have bloody or black poop (stools) or poop that look like tar.  You have a very bad headache, a stiff neck, or both.  You have a rash.  You have very bad pain, cramping, or bloating in your belly (abdomen).  You have trouble breathing.  You are breathing very quickly.  Your heart is beating very quickly.  Your skin feels cold and clammy.  You feel confused.  You have pain when you pee.  You have signs of dehydration, such as: ? Dark pee, hardly any pee, or no pee. ? Cracked lips. ? Dry mouth. ? Sunken eyes. ? Sleepiness. ? Weakness. These symptoms may be an emergency. Do not wait to see if the symptoms will go away. Get medical help right away. Call your local emergency services (911 in the [place] Do not drive yourself to the hospital. This information is   not intended to replace advice given to you by your health care provider. Make sure you discuss any questions you have with your health care provider. Document Released: 05/10/2008 Document Revised: 06/11/2016 Document Reviewed: 07/29/2015 Elsevier Interactive Patient Education  2018 [employer]vier Inc[employer]  

## 2018-11-04 NOTE — MAU Provider Note (Signed)
  History     CSN: [id number]  Arrival date and time: 11/04/18 1647   First Provider Initiated Contact with Patient 11/04/18 1724      Chief Complaint  Patient presents with  . Emesis   Ms. Lyda JesterCurtis presents with nausea and vomiting that started overnight around 3am States that she woke up from her sleep with these symptoms Had not been taking anything for nausea, vomiting or reflux States that she hasn't had any problems with N/V in this or prior pregnancies Does not feel like she's coming down with anything  Denies respiratory, urinary tract, GI and reproductive symptoms     Past Medical History:  Diagnosis Date  . Anemia    on Iron at home  . 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 SVD (spontaneous vaginal delivery) 08/30/2017    Past Surgical History:  Procedure Laterality Date  . ANTERIOR CRUCIATE LIGAMENT REPAIR    . CESAREAN SECTION MULTI-GESTATIONAL N/A 06/14/2015   Procedure: MULTI-GESTATIONAL VAGINAL DELIVERY, POSSIBLE CESAREAN SECTION;  Surgeon: Lavina Hammanodd Meisinger, MD;  Location: [facility];  Service: Obstetrics;  Laterality: N/A;  . CYSTOSTOMY W/ BLADDER DILATION    . WISDOM TOOTH EXTRACTION      No family history on file.  Social History   Tobacco Use  . Smoking status: Never Smoker  . Smokeless tobacco: Never Used  Substance Use Topics  . Alcohol use: No  . Drug use: No    Allergies: No Known Allergies  Medications Prior to Admission  Medication Sig Dispense Refill Last Dose  . ibuprofen (ADVIL,MOTRIN) 600 MG tablet Take 1 tablet (600 mg total) by mouth every 6 (six) hours. 30 tablet 0   . Prenatal Vit-Fe Fumarate-FA (PRENATAL MULTIVITAMIN) TABS tablet Take 1 tablet by mouth daily at 12 noon. 100 tablet 3 08/28/2017 at Unknown time    Review of Systems  All other systems reviewed and are negative.  Physical Exam   Blood pressure 108/63, pulse (!) 138, temperature 99.3 F (37.4 C), resp. rate 18, height 5\' 9"  (1.753 m), weight 106.1 kg, unknown if currently  breastfeeding.  Physical Exam  Constitutional: She is oriented to person, place, and time. She appears well-developed and well-nourished. No distress.  HENT:  Head: Normocephalic and atraumatic.  Eyes: Right eye exhibits no discharge. Left eye exhibits no discharge. No scleral icterus.  Cardiovascular: Normal rate and regular rhythm.  Respiratory: Effort normal. No respiratory distress.  Neurological: She is alert and oriented to person, place, and time.  Skin: Skin is warm and dry. No rash noted.  Psychiatric: She has a normal mood and affect. Her behavior is normal. Judgment and thought content normal.    MAU Course  Procedures  Assessment and Plan  Nausea and vomiting during pregnancy - Plan: Discharge patient - treated with IV zofran and IV fluid resuscitation with improvement; able to tolerate PO prior to discharge - patient also febrile shortly before discharge; treated with tylenol - return precautions discussed in detail - follow up as scheduled or PRN    Gwenevere AbbotNimeka Willys Salvino  Ob Fellow  11/04/2018, 6:03 PM

## 2018-11-06 LAB — URINE CULTURE

## 2018-11-27 ENCOUNTER — Encounter (HOSPITAL_COMMUNITY): Payer: Self-pay | Admitting: *Deleted

## 2018-11-27 ENCOUNTER — Inpatient Hospital Stay (HOSPITAL_COMMUNITY)
Admission: AD | Admit: 2018-11-27 | Discharge: 2018-11-29 | DRG: 807 | Disposition: A | Discharge status: Home / Self Care | Payer: Federal, State, Local not specified - PPO | Attending: Obstetrics and Gynecology | Admitting: Obstetrics and Gynecology

## 2018-11-27 ENCOUNTER — Inpatient Hospital Stay (HOSPITAL_COMMUNITY): Payer: Federal, State, Local not specified - PPO | Admitting: Anesthesiology

## 2018-11-27 ENCOUNTER — Other Ambulatory Visit: Payer: Self-pay

## 2018-11-27 DIAGNOSIS — Z3483 Encounter for supervision of other normal pregnancy, third trimester: Secondary | ICD-10-CM | POA: Diagnosis present

## 2018-11-27 DIAGNOSIS — Z3A38 38 weeks gestation of pregnancy: Secondary | ICD-10-CM

## 2018-11-27 DIAGNOSIS — O99824 Streptococcus B carrier state complicating childbirth: Secondary | ICD-10-CM | POA: Diagnosis present

## 2018-11-27 LAB — TYPE AND SCREEN
ABO/RH(D): A POS
Antibody Screen: NEGATIVE

## 2018-11-27 LAB — CBC
HCT: 32.9 % — ABNORMAL LOW (ref 36.0–46.0)
HEMOGLOBIN: 10.6 g/dL — AB (ref 12.0–15.0)
MCH: 27.5 pg (ref 26.0–34.0)
MCHC: 32.2 g/dL (ref 30.0–36.0)
MCV: 85.5 fL (ref 80.0–100.0)
Platelets: 270 10*3/uL (ref 150–400)
RBC: 3.85 MIL/uL — ABNORMAL LOW (ref 3.87–5.11)
RDW: 13.8 % (ref 11.5–15.5)
WBC: 7.5 10*3/uL (ref 4.0–10.5)
nRBC: 0 % (ref 0.0–0.2)

## 2018-11-27 LAB — RPR: RPR Ser Ql: NONREACTIVE

## 2018-11-27 MED ORDER — LIDOCAINE HCL (PF) 1 % IJ SOLN
INTRAMUSCULAR | Status: DC | PRN
  Administered 2018-11-27: 5 mL via EPIDURAL

## 2018-11-27 MED ORDER — PHENYLEPHRINE 40 MCG/ML (10ML) SYRINGE FOR IV PUSH (FOR BLOOD PRESSURE SUPPORT): 80.0000 ug | PREFILLED_SYRINGE | INTRAVENOUS | Status: DC | PRN

## 2018-11-27 MED ORDER — LACTATED RINGERS IV SOLN
500.0000 mL | Freq: Once | INTRAVENOUS | Status: AC
  Administered 2018-11-27: 500 mL via INTRAVENOUS

## 2018-11-27 MED ORDER — OXYCODONE-ACETAMINOPHEN 5-325 MG PO TABS: 1.0000 | ORAL_TABLET | ORAL | Status: DC | PRN

## 2018-11-27 MED ORDER — OXYCODONE-ACETAMINOPHEN 5-325 MG PO TABS: 2.0000 | ORAL_TABLET | ORAL | Status: DC | PRN

## 2018-11-27 MED ORDER — MAGNESIUM HYDROXIDE 400 MG/5ML PO SUSP: 30.0000 mL | ORAL | Status: DC | PRN

## 2018-11-27 MED ORDER — DIBUCAINE 1 % RE OINT: 1.0000 "application " | TOPICAL_OINTMENT | RECTAL | Status: DC | PRN

## 2018-11-27 MED ORDER — IBUPROFEN 600 MG PO TABS
600.0000 mg | ORAL_TABLET | Freq: Four times a day (QID) | ORAL | Status: DC
  Administered 2018-11-27 – 2018-11-29 (×9): 600 mg via ORAL
  Filled 2018-11-27 (×9): qty 1

## 2018-11-27 MED ORDER — COCONUT OIL OIL: 1.0000 "application " | TOPICAL_OIL | Status: DC | PRN

## 2018-11-27 MED ORDER — SOD CITRATE-CITRIC ACID 500-334 MG/5ML PO SOLN: 30.0000 mL | ORAL | Status: DC | PRN

## 2018-11-27 MED ORDER — FENTANYL 2.5 MCG/ML BUPIVACAINE 1/10 % EPIDURAL INFUSION (WH - ANES)
14.0000 mL/h | INTRAMUSCULAR | Status: DC | PRN
  Administered 2018-11-27: 14 mL/h via EPIDURAL

## 2018-11-27 MED ORDER — EPHEDRINE 5 MG/ML INJ
10.0000 mg | INTRAVENOUS | Status: DC | PRN
  Filled 2018-11-27: qty 2

## 2018-11-27 MED ORDER — DIPHENHYDRAMINE HCL 50 MG/ML IJ SOLN: 12.5000 mg | INTRAMUSCULAR | Status: DC | PRN

## 2018-11-27 MED ORDER — OXYCODONE HCL 5 MG PO TABS: 10.0000 mg | ORAL_TABLET | ORAL | Status: DC | PRN

## 2018-11-27 MED ORDER — ONDANSETRON HCL 4 MG PO TABS: 4.0000 mg | ORAL_TABLET | ORAL | Status: DC | PRN

## 2018-11-27 MED ORDER — PHENYLEPHRINE 40 MCG/ML (10ML) SYRINGE FOR IV PUSH (FOR BLOOD PRESSURE SUPPORT)
80.0000 ug | PREFILLED_SYRINGE | INTRAVENOUS | Status: DC | PRN
  Filled 2018-11-27: qty 10

## 2018-11-27 MED ORDER — PRENATAL MULTIVITAMIN CH
1.0000 | ORAL_TABLET | Freq: Every day | ORAL | Status: DC
  Administered 2018-11-27 – 2018-11-28 (×2): 1 via ORAL
  Filled 2018-11-27 (×2): qty 1

## 2018-11-27 MED ORDER — PHENYLEPHRINE 40 MCG/ML (10ML) SYRINGE FOR IV PUSH (FOR BLOOD PRESSURE SUPPORT)
PREFILLED_SYRINGE | INTRAVENOUS | Status: AC
  Filled 2018-11-27: qty 10

## 2018-11-27 MED ORDER — SENNOSIDES-DOCUSATE SODIUM 8.6-50 MG PO TABS
2.0000 | ORAL_TABLET | ORAL | Status: DC
  Administered 2018-11-27 – 2018-11-28 (×2): 2 via ORAL
  Filled 2018-11-27 (×2): qty 2

## 2018-11-27 MED ORDER — ONDANSETRON HCL 4 MG/2ML IJ SOLN
4.0000 mg | Freq: Four times a day (QID) | INTRAMUSCULAR | Status: DC | PRN
  Administered 2018-11-27: 4 mg via INTRAVENOUS
  Filled 2018-11-27: qty 2

## 2018-11-27 MED ORDER — TETANUS-DIPHTH-ACELL PERTUSSIS 5-2.5-18.5 LF-MCG/0.5 IM SUSP: 0.5000 mL | Freq: Once | INTRAMUSCULAR | Status: DC

## 2018-11-27 MED ORDER — LIDOCAINE HCL (PF) 1 % IJ SOLN
30.0000 mL | INTRAMUSCULAR | Status: AC | PRN
  Administered 2018-11-27: 30 mL via SUBCUTANEOUS
  Filled 2018-11-27: qty 30

## 2018-11-27 MED ORDER — OXYCODONE HCL 5 MG PO TABS: 5.0000 mg | ORAL_TABLET | ORAL | Status: DC | PRN

## 2018-11-27 MED ORDER — BENZOCAINE-MENTHOL 20-0.5 % EX AERO
1.0000 "application " | INHALATION_SPRAY | CUTANEOUS | Status: DC | PRN
  Administered 2018-11-27: 1 via TOPICAL
  Filled 2018-11-27: qty 56

## 2018-11-27 MED ORDER — FENTANYL 2.5 MCG/ML BUPIVACAINE 1/10 % EPIDURAL INFUSION (WH - ANES)
INTRAMUSCULAR | Status: AC
  Filled 2018-11-27: qty 100

## 2018-11-27 MED ORDER — ONDANSETRON HCL 4 MG/2ML IJ SOLN: 4.0000 mg | INTRAMUSCULAR | Status: DC | PRN

## 2018-11-27 MED ORDER — DIPHENHYDRAMINE HCL 25 MG PO CAPS
25.0000 mg | ORAL_CAPSULE | Freq: Four times a day (QID) | ORAL | Status: DC | PRN
  Administered 2018-11-27: 25 mg via ORAL
  Filled 2018-11-27: qty 1

## 2018-11-27 MED ORDER — METHYLERGONOVINE MALEATE 0.2 MG/ML IJ SOLN: 0.2000 mg | INTRAMUSCULAR | Status: DC | PRN

## 2018-11-27 MED ORDER — WITCH HAZEL-GLYCERIN EX PADS
1.0000 "application " | MEDICATED_PAD | CUTANEOUS | Status: DC | PRN
  Administered 2018-11-27: 1 via TOPICAL

## 2018-11-27 MED ORDER — SODIUM CHLORIDE 0.9 % IV SOLN
2.0000 g | Freq: Once | INTRAVENOUS | Status: AC
  Administered 2018-11-27: 2 g via INTRAVENOUS
  Filled 2018-11-27: qty 2

## 2018-11-27 MED ORDER — METHYLERGONOVINE MALEATE 0.2 MG PO TABS: 0.2000 mg | ORAL_TABLET | ORAL | Status: DC | PRN

## 2018-11-27 MED ORDER — EPHEDRINE 5 MG/ML INJ: 10.0000 mg | INTRAVENOUS | Status: DC | PRN

## 2018-11-27 MED ORDER — LACTATED RINGERS IV SOLN: 500.0000 mL | INTRAVENOUS | Status: DC | PRN

## 2018-11-27 MED ORDER — LACTATED RINGERS IV SOLN: 500.0000 mL | Freq: Once | INTRAVENOUS | Status: DC

## 2018-11-27 MED ORDER — LACTATED RINGERS IV SOLN
INTRAVENOUS | Status: DC
  Administered 2018-11-27: 04:00:00 via INTRAVENOUS

## 2018-11-27 MED ORDER — ACETAMINOPHEN 325 MG PO TABS: 650.0000 mg | ORAL_TABLET | ORAL | Status: DC | PRN

## 2018-11-27 MED ORDER — ACETAMINOPHEN 325 MG PO TABS
650.0000 mg | ORAL_TABLET | ORAL | Status: DC | PRN
  Administered 2018-11-27 – 2018-11-28 (×4): 650 mg via ORAL
  Filled 2018-11-27 (×4): qty 2

## 2018-11-27 MED ORDER — MEASLES, MUMPS & RUBELLA VAC IJ SOLR: 0.5000 mL | Freq: Once | INTRAMUSCULAR | Status: DC

## 2018-11-27 MED ORDER — FLEET ENEMA 7-19 GM/118ML RE ENEM: 1.0000 | ENEMA | RECTAL | Status: DC | PRN

## 2018-11-27 MED ORDER — OXYTOCIN 40 UNITS IN LACTATED RINGERS INFUSION - SIMPLE MED
2.5000 [IU]/h | INTRAVENOUS | Status: DC
  Administered 2018-11-27: 2.5 [IU]/h via INTRAVENOUS
  Filled 2018-11-27: qty 1000

## 2018-11-27 MED ORDER — SIMETHICONE 80 MG PO CHEW
80.0000 mg | CHEWABLE_TABLET | ORAL | Status: DC | PRN
  Administered 2018-11-28: 80 mg via ORAL

## 2018-11-27 MED ORDER — ZOLPIDEM TARTRATE 5 MG PO TABS: 5.0000 mg | ORAL_TABLET | Freq: Every evening | ORAL | Status: DC | PRN

## 2018-11-27 MED ORDER — OXYTOCIN BOLUS FROM INFUSION
500.0000 mL | Freq: Once | INTRAVENOUS | Status: AC
  Administered 2018-11-27: 500 mL via INTRAVENOUS

## 2018-11-27 NOTE — Anesthesia Postprocedure Evaluation (Signed)
Anesthesia Post Note  Patient: Kristen Patterson  Procedure(s) Performed: AN AD HOC LABOR EPIDURAL     Patient location during evaluation: Mother Baby Anesthesia Type: Epidural Level of consciousness: awake and alert Pain management: pain level controlled Vital Signs Assessment: post-procedure vital signs reviewed and stable Respiratory status: spontaneous breathing, nonlabored ventilation and respiratory function stable Cardiovascular status: stable Postop Assessment: no headache, no backache and epidural receding Anesthetic complications: no    Last Vitals:  Vitals:   11/27/18 0631 11/27/18 0700  BP:  (!) 120/59  Pulse:  69  Resp: 20 18  Temp: 36.8 C   SpO2:  98%    Last Pain:  Vitals:   11/27/18 0700  TempSrc:   PainSc: 0-No pain   Pain Goal: Patients Stated Pain Goal: 0 (11/27/18 0139)               Roselle LocusLOWDER,Sama Arauz

## 2018-11-27 NOTE — Lactation Note (Signed)
This note was copied from a baby's chart. Lactation Consultation Note  Patient Name: [name]'UToday's Date: 11/27/2018  Mom had said she wanted to breastfeed and formula feed on admission.  She now has decided to exclusively formula feed.   Maternal Data    Feeding    LATCH Score                   Interventions    Lactation Tools Discussed/Used     Consult Status      Huston FoleyMOULDEN, Arvle Grabe S 11/27/2018, 10:11 AM

## 2018-11-27 NOTE — Anesthesia Postprocedure Evaluation (Signed)
Anesthesia Post Note  Patient: Kristen Patterson  Procedure(s) Performed: AN AD HOC LABOR EPIDURAL     Anesthesia Post Evaluation  Last Vitals:  Vitals:   11/27/18 0631 11/27/18 0700  BP:  (!) 120/59  Pulse:  69  Resp: 20 18  Temp: 36.8 C   SpO2:  98%    Last Pain:  Vitals:   11/27/18 0700  TempSrc:   PainSc: 0-No pain   Pain Goal: Patients Stated Pain Goal: 0 (11/27/18 0139)               Roselle LocusLOWDER,Kaydence Baba

## 2018-11-27 NOTE — Anesthesia Preprocedure Evaluation (Signed)
Anesthesia Evaluation  Patient identified by MRN, date of birth, ID band Patient awake    Reviewed: Allergy & Precautions, Patient's Chart, lab work & pertinent test results  Airway Mallampati: II       Dental no notable dental hx.    Pulmonary neg pulmonary ROS,    Pulmonary exam normal        Cardiovascular Normal cardiovascular exam     Neuro/Psych negative neurological ROS  negative psych ROS   GI/Hepatic negative GI ROS, Neg liver ROS,   Endo/Other  negative endocrine ROS  Renal/GU negative Renal ROS     Musculoskeletal   Abdominal   Peds  Hematology   Anesthesia Other Findings   Reproductive/Obstetrics (+) Pregnancy                             Anesthesia Physical Anesthesia Plan  ASA: II  Anesthesia Plan: Epidural   Post-op Pain Management:    Induction:   PONV Risk Score and Plan:   Airway Management Planned: Natural Airway  Additional Equipment: None  Intra-op Plan:   Post-operative Plan:   Informed Consent: I have reviewed the patients History and Physical, chart, labs and discussed the procedure including the risks, benefits and alternatives for the proposed anesthesia with the patient or authorized representative who has indicated his/her understanding and acceptance.     Plan Discussed with:   Anesthesia Plan Comments: (Lab Results      Component                Value               Date                      WBC                      7.5                 11/27/2018                HGB                      10.6 (L)            11/27/2018                HCT                      32.9 (L)            11/27/2018                MCV                      85.5                11/27/2018                PLT                      270                 11/27/2018           )        Anesthesia Quick Evaluation

## 2018-11-27 NOTE — H&P (Signed)
Kristen Patterson is a 22 y.o. female, G4 P3004, EGA 38+ weeks with EDC 1-1 presenting for ctx.  On eval in MAU, VE 5 cm with ctx q 2 min.  She has been ad mitted, received an epidural and Ampicillin for +GBS.  Prenatal care essentially uncomplicated. . OB History    Gravida  4   Para  3   Term  3   Preterm  0   AB  0   Living  4     SAB  0   TAB  0   Ectopic  0   Multiple  1   Live Births  4          Past Medical History:  Diagnosis Date  . Anemia    on Iron at home  . 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 SVD (spontaneous vaginal delivery) 08/30/2017   Past Surgical History:  Procedure Laterality Date  . ANTERIOR CRUCIATE LIGAMENT REPAIR    . CESAREAN SECTION MULTI-GESTATIONAL N/A 06/14/2015   Procedure: MULTI-GESTATIONAL VAGINAL DELIVERY, POSSIBLE CESAREAN SECTION;  Surgeon: Lavina Hammanodd Rocklyn Mayberry, MD;  Location: [facility];  Service: Obstetrics;  Laterality: N/A;  . CYSTOSTOMY W/ BLADDER DILATION    . WISDOM TOOTH EXTRACTION    She did NOT have a c-section but I cannot clear this from the pre-populated history   Family History: family history is not on file. Social History:  reports that she has never smoked. She has never used smokeless tobacco. She reports that she does not drink alcohol or use drugs.     Maternal Diabetes: No Genetic Screening: Declined Maternal Ultrasounds/Referrals: Normal Fetal Ultrasounds or other Referrals:  None Maternal Substance Abuse:  No Significant Maternal Medications:  None Significant Maternal Lab Results:  Lab values include: Group B Strep positive Other Comments:  None  Review of Systems  Respiratory: Negative.   Cardiovascular: Negative.    Maternal Medical History:  Reason for admission: Contractions.   Contractions: Frequency: regular.   Perceived severity is strong.    Fetal activity: Perceived fetal activity is normal.    Prenatal complications: no prenatal complications Prenatal Complications - Diabetes:  none.    Dilation: Lip/rim Effacement (%): 100 Station: 0 Exam by:: Kristen Boschogers RN Blood pressure 132/82, pulse 95, temperature 97.8 F (36.6 C), temperature source Oral, resp. rate 20, height 5\' 9"  (1.753 m), weight 108.9 kg, last menstrual period 03/01/2018, SpO2 97 %, unknown if currently breastfeeding. Maternal Exam:  Uterine Assessment: Contraction strength is moderate.  Contraction frequency is regular.   Abdomen: Patient reports no abdominal tenderness. Estimated fetal weight is 8 lbs.   Fetal presentation: vertex  Introitus: Normal vulva. Normal vagina.  Amniotic fluid character: clear.  Pelvis: adequate for delivery.   Cervix: Cervix evaluated by digital exam.     Fetal Exam Fetal Monitor Review: Mode: ultrasound.   Baseline rate: 120-130.  Variability: moderate (6-25 bpm).   Pattern: accelerations present and no decelerations.    Fetal State Assessment: Category I - tracings are normal.     Physical Exam  Vitals reviewed. Constitutional: She appears well-developed and well-nourished.  Cardiovascular: Normal rate and regular rhythm.  Respiratory: Effort normal. No respiratory distress.  GI: Soft.  Genitourinary:    Vulva normal.     Prenatal labs: ABO, Rh: --/--/A POS (12/23 16100223) Antibody: NEG (12/23 0223) Rubella:  Immune RPR:   NR HBsAg:    HIV:   NR GBS:   pos in urine  Assessment/Plan: IUP at 38+ weeks admitted  in active labor, +GBS in urine.  AROM done at [facility] dilated, anticipate SVD soon, received Ampicillin.   Leighton Roachodd D Aadin Gaut 11/27/2018, 4:40 AM

## 2018-11-27 NOTE — MAU Note (Signed)
Pt presents to MAU c/o ctx every 4-315min. No bleeding or LOF. +FM.

## 2018-11-27 NOTE — Anesthesia Procedure Notes (Signed)
Epidural Patient location during procedure: OB Start time: 11/27/2018 3:22 AM End time: 11/27/2018 3:28 AM  Staffing Anesthesiologist: Shelton SilvasHollis, Tylar Merendino D, MD Performed: anesthesiologist   Preanesthetic Checklist Completed: patient identified, site marked, surgical consent, pre-op evaluation, timeout performed, IV checked, risks and benefits discussed and monitors and equipment checked  Epidural Patient position: sitting Prep: ChloraPrep Patient monitoring: heart rate, continuous pulse ox and blood pressure Approach: midline Location: L3-L4 Injection technique: LOR saline  Needle:  Needle type: Tuohy  Needle gauge: 17 G Needle length: 9 cm Catheter type: closed end flexible Catheter size: 20 Guage Test dose: negative and 1.5% lidocaine  Assessment Events: blood not aspirated, injection not painful, no injection resistance and no paresthesia  Additional Notes LOR @ 4  Patient identified. Risks/Benefits/Options discussed with patient including but not limited to bleeding, infection, nerve damage, paralysis, failed block, incomplete pain control, headache, blood pressure changes, nausea, vomiting, reactions to medications, itching and postpartum back pain. Confirmed with bedside nurse the patient's most recent platelet count. Confirmed with patient that they are not currently taking any anticoagulation, have any bleeding history or any family history of bleeding disorders. Patient expressed understanding and wished to proceed. All questions were answered. Sterile technique was used throughout the entire procedure. Please see nursing notes for vital signs. Test dose was given through epidural catheter and negative prior to continuing to dose epidural or start infusion. Warning signs of high block given to the patient including shortness of breath, tingling/numbness in hands, complete motor block, or any concerning symptoms with instructions to call for help. Patient was given instructions  on fall risk and not to get out of bed. All questions and concerns addressed with instructions to call with any issues or inadequate analgesia.    Reason for block:procedure for pain

## 2018-11-27 NOTE — Progress Notes (Signed)
Patient ID: Kristen Patterson, female   DOB: [birth year], 22 y.o.   MRN: [id number] DOD  Pt doing well, baby bottlefeeding fine already  Fundus firm, lochia normal Continue pp care Plans circ in office Hoping for d/c tomorrow

## 2018-11-28 NOTE — Progress Notes (Signed)
Post Partum Day 1 Subjective: no complaints, up ad lib and tolerating PO  Objective: Blood pressure 117/78, pulse 62, temperature 97.8 F (36.6 C), temperature source Oral, resp. rate 16, height 5\' 9"  (1.753 m), weight 108.9 kg, last menstrual period 03/01/2018, SpO2 97 %, unknown if currently breastfeeding.  Physical Exam:  General: alert and cooperative Lochia: appropriate Uterine Fundus: firm   Recent Labs    11/27/18 0223  HGB 10.6*  HCT 32.9*    Assessment/Plan: Plan for discharge tomorrow, baby unable to go due to fast delivery with GBS + status Plans circ in office   LOS: 1 day   Oliver PilaKathy W Ranee Peasley 11/28/2018, 9:40 AM

## 2018-11-29 MED ORDER — OXYCODONE HCL 5 MG PO TABS: 5.0000 mg | ORAL_TABLET | ORAL | 0 refills | Status: DC | PRN

## 2018-11-29 MED ORDER — IBUPROFEN 600 MG PO TABS: 600.0000 mg | ORAL_TABLET | Freq: Four times a day (QID) | ORAL | 0 refills | Status: DC

## 2018-11-29 NOTE — Discharge Summary (Signed)
OB Discharge Summary     Patient Name: Kristen MasterRhiannon Prout DOB: Feb 28, 1996 MRN: [id number]  Date of admission: 11/27/2018 Delivering MD: Lavina HammanMEISINGER, Kienan Doublin   Date of discharge: 11/29/2018  Admitting diagnosis: 38WKS CTX Intrauterine pregnancy: 681w5d     Secondary diagnosis:  Active Problems:   Normal labor      Discharge diagnosis: Term Pregnancy Delivered                                   Hospital course:  Onset of Labor With Vaginal Delivery     22 y.o. yo [id number] at 671w5d was admitted in Active Labor on 11/27/2018. Patient had an uncomplicated labor course as follows:  Membrane Rupture Time/Date: 4:32 AM ,11/27/2018   Intrapartum Procedures: Episiotomy: None [1]                                         Lacerations:  1st degree [2]  Patient had a delivery of a Viable infant. 11/27/2018  Information for the patient's newborn:  Druscilla BrownieCurtis, Boy Lavoris [[id number]][[id number]]  Delivery Method: Vag-Spont    Pateint had an uncomplicated postpartum course.  She is ambulating, tolerating a regular diet, passing flatus, and urinating well. Patient is discharged home in stable condition on 11/29/18.   Physical exam  Vitals:   11/28/18 0533 11/28/18 1410 11/28/18 2308 11/29/18 0544  BP: 117/78 135/76 (!) 141/76 115/82  Pulse: 62 69 63 60  Resp: 16 18 18 18   Temp: 97.8 F (36.6 C) 98 F (36.7 C) 97.6 F (36.4 C)   TempSrc: Oral  Oral   SpO2:    100%  Weight:      Height:       General: alert Lochia: appropriate Uterine Fundus: firm  Labs: Lab Results  Component Value Date   WBC 7.5 11/27/2018   HGB 10.6 (L) 11/27/2018   HCT 32.9 (L) 11/27/2018   MCV 85.5 11/27/2018   PLT 270 11/27/2018   CMP Latest Ref Rng & Units 11/04/2018  Glucose 70 - 99 mg/dL 94  BUN 6 - 20 mg/dL 9  Creatinine 2.530.44 - 6.641.00 mg/dL 4.030.45  Sodium 474135 - 259145 mmol/L 132(L)  Potassium 3.5 - 5.1 mmol/L 4.2  Chloride 98 - 111 mmol/L 103  CO2 22 - 32 mmol/L 16(L)  Calcium 8.9 - 10.3 mg/dL 8.1(L)  Total Protein 6.5 -  8.1 g/dL 6.3(L)  Total Bilirubin 0.3 - 1.2 mg/dL 1.2  Alkaline Phos 38 - 126 U/L 156(H)  AST 15 - 41 U/L 22  ALT 0 - 44 U/L 9    Discharge instruction: per After Visit Summary and "Baby and Me Booklet".  After visit meds:  Allergies as of 11/29/2018   No Known Allergies     Medication List    TAKE these medications   ibuprofen 600 MG tablet Commonly known as:  ADVIL,MOTRIN Take 1 tablet (600 mg total) by mouth every 6 (six) hours.   ondansetron 4 MG disintegrating tablet Commonly known as:  ZOFRAN ODT Take 1 tablet (4 mg total) by mouth every 8 (eight) hours as needed for nausea or vomiting.   oxyCODONE 5 MG immediate release tablet Commonly known as:  Oxy IR/ROXICODONE Take 1 tablet (5 mg total) by mouth every 4 (four) hours as needed for severe pain.   pantoprazole 20 MG tablet Commonly  known as:  PROTONIX Take 20 mg by mouth daily.   prenatal multivitamin Tabs tablet Take 1 tablet by mouth daily at 12 noon.       Diet: routine diet  Activity: Advance as tolerated. Pelvic rest for 6 weeks.   Outpatient follow up:6 weeks   Newborn Data: Live born female  Birth Weight: 8 lb 11.3 oz (3950 g) APGAR: 9, 9  Newborn Delivery   Birth date/time:  11/27/2018 04:54:00 Delivery type:  Vaginal, Spontaneous     Baby Feeding: Breast Disposition:home with mother   11/29/2018 Zenaida Nieceodd D Amani Nodarse, MD

## 2018-11-29 NOTE — Progress Notes (Signed)
PPD #2 Doing well Afeb, VSS D/c home 

## 2018-11-29 NOTE — Discharge Instructions (Signed)
As per discharge pamphlet °

## 2019-05-07 ENCOUNTER — Ambulatory Visit
Admission: EM | Admit: 2019-05-07 | Discharge: 2019-05-07 | Disposition: A | Discharge status: Home / Self Care | Payer: Federal, State, Local not specified - PPO | Attending: Emergency Medicine | Admitting: Emergency Medicine

## 2019-05-07 ENCOUNTER — Other Ambulatory Visit: Payer: Self-pay

## 2019-05-07 DIAGNOSIS — L247 Irritant contact dermatitis due to plants, except food: Secondary | ICD-10-CM | POA: Diagnosis not present

## 2019-05-07 MED ORDER — PREDNISONE 20 MG PO TABS: 20.0000 mg | ORAL_TABLET | Freq: Two times a day (BID) | ORAL | 0 refills | Status: AC

## 2019-05-07 MED ORDER — HYDROXYZINE HCL 25 MG PO TABS: 25.0000 mg | ORAL_TABLET | Freq: Four times a day (QID) | ORAL | 0 refills | Status: DC

## 2019-05-07 NOTE — ED Triage Notes (Signed)
Pt has rash on arms and face, possible poison ivy exposure

## 2019-05-07 NOTE — Discharge Instructions (Addendum)
Wash with warm water and mild soap Prednisone prescribed.Take oral steroid as prescribed and to completion Hydroxyzine as needed for itching.  DO NOT TAKE PRIOR TO DRIVING OR OPERATING HEAVY MACHINERY.  THIS MEDICATION MAY MAKE YOU DROWSY Follow up with PCP if symptoms persists.  If you do not have a PCP in the area I have attached a list of local PCPs you may follow up with and/or to establish care. Return or go to the ER if you have any new or worsening symptoms such as fever, chills, nausea, vomiting, redness, swelling, discharge, if symptoms do not improve with medications, etc..Marland Kitchen

## 2019-05-07 NOTE — ED Provider Notes (Signed)
[facility]   [id number] 05/07/19 Arrival Time: 1236  CC: Rash  SUBJECTIVE:  Kristen Patterson is a 23 y.o. female who presents with a rash to arms, and face x few days.  Admits to exposure to poison ivy exposure from husband.  Describes it as itchy, red and spreading.  Has tried OTC benadryl with minimal relief.  Symptoms are made worse with scratching.  Denies fever, chills, nausea, vomiting, swelling, discharge, oral lesions, SOB, chest pain, abdominal pain, changes in bowel or bladder function.    ROS: As per HPI.  Past Medical History:  Diagnosis Date  . Anemia    on Iron at home  . 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 SVD (spontaneous vaginal delivery) 08/30/2017   Past Surgical History:  Procedure Laterality Date  . ANTERIOR CRUCIATE LIGAMENT REPAIR    . CESAREAN SECTION MULTI-GESTATIONAL N/A 06/14/2015   Procedure: MULTI-GESTATIONAL VAGINAL DELIVERY, POSSIBLE CESAREAN SECTION;  Surgeon: Lavina Hamman, MD;  Location: [facility];  Service: Obstetrics;  Laterality: N/A;  . CYSTOSTOMY W/ BLADDER DILATION    . WISDOM TOOTH EXTRACTION     No Known Allergies No current facility-administered medications on file prior to encounter.    Current Outpatient Medications on File Prior to Encounter  Medication Sig Dispense Refill  . ibuprofen (ADVIL,MOTRIN) 600 MG tablet Take 1 tablet (600 mg total) by mouth every 6 (six) hours. 30 tablet 0   Social History   Socioeconomic History  . Marital status: Married    Spouse name: Not on file  . Number of children: Not on file  . Years of education: Not on file  . Highest education level: Not on file  Occupational History  . Not on file  Social Needs  . Financial resource strain: Not on file  . Food insecurity:    Worry: Not on file    Inability: Not on file  . Transportation needs:    Medical: Not on file    Non-medical: Not on file  Tobacco Use  . Smoking status: Never Smoker  . Smokeless tobacco: Never Used  Substance and Sexual  Activity  . Alcohol use: No  . Drug use: No  . Sexual activity: Yes  Lifestyle  . Physical activity:    Days per week: Not on file    Minutes per session: Not on file  . Stress: Not on file  Relationships  . Social connections:    Talks on phone: Not on file    Gets together: Not on file    Attends religious service: Not on file    Active member of club or organization: Not on file    Attends meetings of clubs or organizations: Not on file    Relationship status: Not on file  . Intimate partner violence:    Fear of current or ex partner: Not on file    Emotionally abused: Not on file    Physically abused: Not on file    Forced sexual activity: Not on file  Other Topics Concern  . Not on file  Social History Narrative  . Not on file   History reviewed. No pertinent family history.  OBJECTIVE: Vitals:   05/07/19 1246  BP: 127/82  Pulse: 79  Resp: 18  Temp: 98.5 F (36.9 C)  SpO2: 95%    General appearance: alert; no distress Head: NCAT Lungs: clear to auscultation bilaterally Heart: regular rate and rhythm.  Radial pulse 2+ bilaterally Extremities: no edema Skin: warm and dry; areas  of erythematous macules/ papules in linear distribution to left arm and left side of face, NTTP, blanches with pressure, no obvious drainage or bleeding Psychological: alert and cooperative; normal mood and affect  ASSESSMENT & PLAN:  1. Irritant contact dermatitis due to plants, except food     Meds ordered this encounter  Medications  . predniSONE (DELTASONE) 20 MG tablet    Sig: Take 1 tablet (20 mg total) by mouth 2 (two) times daily with a meal for 5 days.    Dispense:  10 tablet    Refill:  0    Order Specific Question:   Supervising Provider    Answer:   Eustace MooreNELSON, YVONNE SUE [[id number]]  . hydrOXYzine (ATARAX/VISTARIL) 25 MG tablet    Sig: Take 1 tablet (25 mg total) by mouth every 6 (six) hours.    Dispense:  12 tablet    Refill:  0    Order Specific Question:    Supervising Provider    Answer:   Eustace MooreELSON, YVONNE SUE [[id number]]   Wash with warm water and mild soap Prednisone prescribed.Take oral steroid as prescribed and to completion Hydroxyzine as needed for itching.  DO NOT TAKE PRIOR TO DRIVING OR OPERATING HEAVY MACHINERY.  THIS MEDICATION MAY MAKE YOU DROWSY Follow up with PCP if symptoms persists.  If you do not have a PCP in the area I have attached a list of local PCPs you may follow up with and/or to establish care. Return or go to the ER if you have any new or worsening symptoms such as fever, chills, nausea, vomiting, redness, swelling, discharge, if symptoms do not improve with medications, etc...   Reviewed expectations re: course of current medical issues. Questions answered. Outlined signs and symptoms indicating need for more acute intervention. Patient verbalized understanding. After Visit Summary given.   Rennis HardingWurst, Caylie Sandquist, PA-C 05/07/19 1305

## 2019-06-05 LAB — OB RESULTS CONSOLE GC/CHLAMYDIA
Chlamydia: NEGATIVE
Gonorrhea: NEGATIVE

## 2019-06-05 LAB — OB RESULTS CONSOLE ANTIBODY SCREEN: Antibody Screen: NEGATIVE

## 2019-06-05 LAB — OB RESULTS CONSOLE RUBELLA ANTIBODY, IGM: Rubella: IMMUNE

## 2019-06-05 LAB — OB RESULTS CONSOLE HIV ANTIBODY (ROUTINE TESTING): HIV: NONREACTIVE

## 2019-06-05 LAB — OB RESULTS CONSOLE ABO/RH: RH Type: POSITIVE

## 2019-06-05 LAB — OB RESULTS CONSOLE RPR: RPR: NONREACTIVE

## 2019-06-05 LAB — OB RESULTS CONSOLE HEPATITIS B SURFACE ANTIGEN: Hepatitis B Surface Ag: NEGATIVE

## 2019-10-12 ENCOUNTER — Other Ambulatory Visit: Payer: Self-pay

## 2019-10-12 DIAGNOSIS — Z20822 Contact with and (suspected) exposure to covid-19: Secondary | ICD-10-CM

## 2019-10-13 LAB — NOVEL CORONAVIRUS, NAA: SARS-CoV-2, NAA: DETECTED — AB

## 2019-11-21 LAB — OB RESULTS CONSOLE GBS: GBS: NEGATIVE

## 2019-12-03 ENCOUNTER — Telehealth (HOSPITAL_COMMUNITY): Payer: Self-pay | Admitting: *Deleted

## 2019-12-03 ENCOUNTER — Encounter (HOSPITAL_COMMUNITY): Payer: Self-pay | Admitting: *Deleted

## 2019-12-03 NOTE — Telephone Encounter (Signed)
Preadmission screen  

## 2019-12-07 ENCOUNTER — Inpatient Hospital Stay (HOSPITAL_COMMUNITY): Payer: Federal, State, Local not specified - PPO | Admitting: Anesthesiology

## 2019-12-07 ENCOUNTER — Inpatient Hospital Stay (HOSPITAL_COMMUNITY)
Admission: AD | Admit: 2019-12-07 | Discharge: 2019-12-08 | DRG: 807 | Disposition: A | Discharge status: Home / Self Care | Payer: Federal, State, Local not specified - PPO | Attending: Obstetrics and Gynecology | Admitting: Obstetrics and Gynecology

## 2019-12-07 ENCOUNTER — Encounter (HOSPITAL_COMMUNITY): Payer: Self-pay | Admitting: Obstetrics and Gynecology

## 2019-12-07 ENCOUNTER — Other Ambulatory Visit: Payer: Self-pay

## 2019-12-07 DIAGNOSIS — O99214 Obesity complicating childbirth: Secondary | ICD-10-CM | POA: Diagnosis present

## 2019-12-07 DIAGNOSIS — Z3A38 38 weeks gestation of pregnancy: Secondary | ICD-10-CM | POA: Diagnosis not present

## 2019-12-07 DIAGNOSIS — O26893 Other specified pregnancy related conditions, third trimester: Secondary | ICD-10-CM | POA: Diagnosis present

## 2019-12-07 LAB — CBC
HCT: 37.2 % (ref 36.0–46.0)
Hemoglobin: 12.6 g/dL (ref 12.0–15.0)
MCH: 31 pg (ref 26.0–34.0)
MCHC: 33.9 g/dL (ref 30.0–36.0)
MCV: 91.6 fL (ref 80.0–100.0)
Platelets: 228 10*3/uL (ref 150–400)
RBC: 4.06 MIL/uL (ref 3.87–5.11)
RDW: 12.8 % (ref 11.5–15.5)
WBC: 7.5 10*3/uL (ref 4.0–10.5)
nRBC: 0 % (ref 0.0–0.2)

## 2019-12-07 LAB — RPR: RPR Ser Ql: NONREACTIVE

## 2019-12-07 LAB — TYPE AND SCREEN
ABO/RH(D): A POS
Antibody Screen: NEGATIVE

## 2019-12-07 LAB — ABO/RH: ABO/RH(D): A POS

## 2019-12-07 MED ORDER — TETANUS-DIPHTH-ACELL PERTUSSIS 5-2.5-18.5 LF-MCG/0.5 IM SUSP: 0.5000 mL | Freq: Once | INTRAMUSCULAR | Status: DC

## 2019-12-07 MED ORDER — FENTANYL-BUPIVACAINE-NACL 0.5-0.125-0.9 MG/250ML-% EP SOLN: 12.0000 mL/h | EPIDURAL | Status: DC | PRN

## 2019-12-07 MED ORDER — PHENYLEPHRINE 40 MCG/ML (10ML) SYRINGE FOR IV PUSH (FOR BLOOD PRESSURE SUPPORT): 80.0000 ug | PREFILLED_SYRINGE | INTRAVENOUS | Status: DC | PRN

## 2019-12-07 MED ORDER — DIPHENHYDRAMINE HCL 50 MG/ML IJ SOLN: 12.5000 mg | INTRAMUSCULAR | Status: DC | PRN

## 2019-12-07 MED ORDER — FLEET ENEMA 7-19 GM/118ML RE ENEM: 1.0000 | ENEMA | RECTAL | Status: DC | PRN

## 2019-12-07 MED ORDER — OXYCODONE-ACETAMINOPHEN 5-325 MG PO TABS: 2.0000 | ORAL_TABLET | ORAL | Status: DC | PRN

## 2019-12-07 MED ORDER — ZOLPIDEM TARTRATE 5 MG PO TABS: 5.0000 mg | ORAL_TABLET | Freq: Every evening | ORAL | Status: DC | PRN

## 2019-12-07 MED ORDER — EPHEDRINE 5 MG/ML INJ: 10.0000 mg | INTRAVENOUS | Status: DC | PRN

## 2019-12-07 MED ORDER — BUTORPHANOL TARTRATE 1 MG/ML IJ SOLN
1.0000 mg | Freq: Once | INTRAMUSCULAR | Status: AC
  Administered 2019-12-07: 1 mg via INTRAVENOUS
  Filled 2019-12-07: qty 1

## 2019-12-07 MED ORDER — LACTATED RINGERS IV SOLN: INTRAVENOUS | Status: DC

## 2019-12-07 MED ORDER — LACTATED RINGERS IV SOLN: 500.0000 mL | INTRAVENOUS | Status: DC | PRN

## 2019-12-07 MED ORDER — SIMETHICONE 80 MG PO CHEW: 80.0000 mg | CHEWABLE_TABLET | ORAL | Status: DC | PRN

## 2019-12-07 MED ORDER — LIDOCAINE HCL (PF) 1 % IJ SOLN
INTRAMUSCULAR | Status: DC | PRN
  Administered 2019-12-07: 6 mL via EPIDURAL

## 2019-12-07 MED ORDER — ONDANSETRON HCL 4 MG PO TABS: 4.0000 mg | ORAL_TABLET | ORAL | Status: DC | PRN

## 2019-12-07 MED ORDER — IBUPROFEN 600 MG PO TABS
600.0000 mg | ORAL_TABLET | Freq: Four times a day (QID) | ORAL | Status: DC
  Administered 2019-12-07 – 2019-12-08 (×4): 600 mg via ORAL
  Filled 2019-12-07 (×4): qty 1

## 2019-12-07 MED ORDER — WITCH HAZEL-GLYCERIN EX PADS: 1.0000 "application " | MEDICATED_PAD | CUTANEOUS | Status: DC | PRN

## 2019-12-07 MED ORDER — DIBUCAINE (PERIANAL) 1 % EX OINT: 1.0000 "application " | TOPICAL_OINTMENT | CUTANEOUS | Status: DC | PRN

## 2019-12-07 MED ORDER — PRENATAL MULTIVITAMIN CH
1.0000 | ORAL_TABLET | Freq: Every day | ORAL | Status: DC
  Administered 2019-12-07: 1 via ORAL
  Filled 2019-12-07: qty 1

## 2019-12-07 MED ORDER — LIDOCAINE HCL (PF) 1 % IJ SOLN: 30.0000 mL | INTRAMUSCULAR | Status: DC | PRN

## 2019-12-07 MED ORDER — OXYTOCIN BOLUS FROM INFUSION
500.0000 mL | Freq: Once | INTRAVENOUS | Status: AC
  Administered 2019-12-07: 500 mL via INTRAVENOUS

## 2019-12-07 MED ORDER — BENZOCAINE-MENTHOL 20-0.5 % EX AERO: 1.0000 "application " | INHALATION_SPRAY | CUTANEOUS | Status: DC | PRN

## 2019-12-07 MED ORDER — LACTATED RINGERS IV SOLN: 500.0000 mL | Freq: Once | INTRAVENOUS | Status: DC

## 2019-12-07 MED ORDER — OXYCODONE-ACETAMINOPHEN 5-325 MG PO TABS: 1.0000 | ORAL_TABLET | ORAL | Status: DC | PRN

## 2019-12-07 MED ORDER — SOD CITRATE-CITRIC ACID 500-334 MG/5ML PO SOLN: 30.0000 mL | ORAL | Status: DC | PRN

## 2019-12-07 MED ORDER — COCONUT OIL OIL: 1.0000 "application " | TOPICAL_OIL | Status: DC | PRN

## 2019-12-07 MED ORDER — OXYCODONE HCL 5 MG PO TABS
5.0000 mg | ORAL_TABLET | ORAL | Status: DC | PRN
  Administered 2019-12-07: 5 mg via ORAL
  Filled 2019-12-07: qty 1

## 2019-12-07 MED ORDER — FENTANYL-BUPIVACAINE-NACL 0.5-0.125-0.9 MG/250ML-% EP SOLN
EPIDURAL | Status: AC
  Filled 2019-12-07: qty 250

## 2019-12-07 MED ORDER — SODIUM CHLORIDE (PF) 0.9 % IJ SOLN
INTRAMUSCULAR | Status: DC | PRN
  Administered 2019-12-07: 12 mL/h via EPIDURAL

## 2019-12-07 MED ORDER — ONDANSETRON HCL 4 MG/2ML IJ SOLN: 4.0000 mg | INTRAMUSCULAR | Status: DC | PRN

## 2019-12-07 MED ORDER — ONDANSETRON HCL 4 MG/2ML IJ SOLN: 4.0000 mg | Freq: Four times a day (QID) | INTRAMUSCULAR | Status: DC | PRN

## 2019-12-07 MED ORDER — ACETAMINOPHEN 325 MG PO TABS
650.0000 mg | ORAL_TABLET | ORAL | Status: DC | PRN
  Administered 2019-12-07: 650 mg via ORAL
  Filled 2019-12-07: qty 2

## 2019-12-07 MED ORDER — DIPHENHYDRAMINE HCL 25 MG PO CAPS: 25.0000 mg | ORAL_CAPSULE | Freq: Four times a day (QID) | ORAL | Status: DC | PRN

## 2019-12-07 MED ORDER — ACETAMINOPHEN 325 MG PO TABS: 650.0000 mg | ORAL_TABLET | ORAL | Status: DC | PRN

## 2019-12-07 MED ORDER — OXYCODONE HCL 5 MG PO TABS: 10.0000 mg | ORAL_TABLET | ORAL | Status: DC | PRN

## 2019-12-07 MED ORDER — SENNOSIDES-DOCUSATE SODIUM 8.6-50 MG PO TABS
2.0000 | ORAL_TABLET | ORAL | Status: DC
  Administered 2019-12-08: 2 via ORAL
  Filled 2019-12-07: qty 2

## 2019-12-07 MED ORDER — OXYTOCIN 40 UNITS IN NORMAL SALINE INFUSION - SIMPLE MED
2.5000 [IU]/h | INTRAVENOUS | Status: DC
  Filled 2019-12-07: qty 1000

## 2019-12-07 NOTE — Anesthesia Preprocedure Evaluation (Signed)
Anesthesia Evaluation  Patient identified by MRN, date of birth, ID band Patient awake    Reviewed: Allergy & Precautions, H&P , NPO status , Patient's Chart, lab work & pertinent test results, reviewed documented beta blocker date and time   Airway Mallampati: III  TM Distance: >3 FB Neck ROM: full    Dental no notable dental hx. (+) Teeth Intact, Dental Advisory Given   Pulmonary neg pulmonary ROS,    Pulmonary exam normal breath sounds clear to auscultation       Cardiovascular negative cardio ROS Normal cardiovascular exam Rhythm:regular Rate:Normal     Neuro/Psych negative neurological ROS  negative psych ROS   GI/Hepatic negative GI ROS, Neg liver ROS,   Endo/Other  Morbid obesity  Renal/GU negative Renal ROS  negative genitourinary   Musculoskeletal   Abdominal   Peds  Hematology  (+) Blood dyscrasia, anemia ,   Anesthesia Other Findings   Reproductive/Obstetrics (+) Pregnancy                             Anesthesia Physical Anesthesia Plan  ASA: III  Anesthesia Plan: Epidural   Post-op Pain Management:    Induction:   PONV Risk Score and Plan:   Airway Management Planned:   Additional Equipment:   Intra-op Plan:   Post-operative Plan:   Informed Consent: I have reviewed the patients History and Physical, chart, labs and discussed the procedure including the risks, benefits and alternatives for the proposed anesthesia with the patient or authorized representative who has indicated his/her understanding and acceptance.     Dental Advisory Given  Plan Discussed with:   Anesthesia Plan Comments: (Labs checked- platelets confirmed with RN in room. Fetal heart tracing, per RN, reported to be stable enough for sitting procedure. Discussed epidural, and patient consents to the procedure:  included risk of possible headache,backache, failed block, allergic reaction, and  nerve injury. This patient was asked if she had any questions or concerns before the procedure started.)        Anesthesia Quick Evaluation

## 2019-12-07 NOTE — H&P (Signed)
Kristen Patterson is a 24 y.o. female G71P5006 s/p SVD - rapid delivery after presentation.  Declined genetic screening.  Relatively uncomplicated PNC.  Received Flu and Tdap in [facility].    OB History    Gravida  5   Para  4   Term  4   Preterm  0   AB  0   Living  5     SAB  0   TAB  0   Ectopic  0   Multiple  1   Live Births  5         G1 twins, VAVD, SVD G2-G4 SVD 5#12-8#15 G5 present  No abn pap No STD  Past Medical History:  Diagnosis Date  . Anemia    on Iron at home  . 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 SVD (spontaneous vaginal delivery) 08/30/2017  . SVD (spontaneous vaginal delivery) 12/07/2019   Past Surgical History:  Procedure Laterality Date  . ANTERIOR CRUCIATE LIGAMENT REPAIR    . CESAREAN SECTION MULTI-GESTATIONAL N/A 06/14/2015   Procedure: MULTI-GESTATIONAL VAGINAL DELIVERY, POSSIBLE CESAREAN SECTION;  Surgeon: Lavina Hamman, MD;  Location: [facility];  Service: Obstetrics;  Laterality: N/A;  . CYSTOSTOMY W/ BLADDER DILATION    . WISDOM TOOTH EXTRACTION     Family History: DM, HTN Social History:  reports that she has never smoked. She has never used smokeless tobacco. She reports that she does not drink alcohol or use drugs.married, SAHM  Meds PNV, Sertraline All NKDA     Maternal Diabetes: No Genetic Screening: Declined Maternal Ultrasounds/Referrals: Normal Fetal Ultrasounds or other Referrals:  None Maternal Substance Abuse:  No Significant Maternal Medications:  None Significant Maternal Lab Results:  Group B Strep negative Other Comments:  None  Review of Systems  Constitutional: Negative.   HENT: Negative.   Eyes: Negative.   Respiratory: Negative.   Cardiovascular: Negative.   Gastrointestinal: Positive for abdominal pain.  Genitourinary: Positive for pelvic pain.  Musculoskeletal: Negative.   Neurological: Negative.   Psychiatric/Behavioral: Negative.    Maternal Medical History:  Reason for admission: Contractions.    Contractions: Frequency: regular.    Fetal activity: Perceived fetal activity is normal.    Prenatal Complications - Diabetes: none.    Dilation: 10 Effacement (%): 80 Station: 0 Exam by:: bovard Blood pressure (!) 158/85, pulse 78, temperature 98.2 F (36.8 C), temperature source Oral, resp. rate 20, height 5\' 9"  (1.753 m), weight 109 kg, last menstrual period 04/06/2019, SpO2 98 %, not currently breastfeeding. Maternal Exam:  Uterine Assessment: Contraction strength is firm.  Contraction frequency is regular.   Abdomen: Patient reports generalized tenderness.  Fundal height is appropriate for gestation.   Estimated fetal weight is 7-8#.   Fetal presentation: vertex  Introitus: Normal vulva. Normal vagina.    Physical Exam  Constitutional: She is oriented to person, place, and time. She appears well-developed and well-nourished.  HENT:  Head: Normocephalic and atraumatic.  Cardiovascular: Normal rate and regular rhythm.  Respiratory: Effort normal and breath sounds normal. No respiratory distress. She has no wheezes.  GI: Soft. Bowel sounds are normal. She exhibits no distension. There is generalized abdominal tenderness.  Genitourinary:    Vulva normal.   Musculoskeletal:        General: Normal range of motion.  Neurological: She is alert and oriented to person, place, and time.  Skin: Skin is warm and dry.  Psychiatric: She has a normal mood and affect. Her behavior is normal.  Prenatal labs: ABO, Rh: --/--/A POS, A POS Performed at [facility], [place] [address] [place], [place] [address]  [phone] 0630) Antibody: NEG (01/01 0630) Rubella: Immune (06/30 0000) RPR: Nonreactive (06/30 0000)  HBsAg: Negative (06/30 0000)  HIV: Non-reactive (06/30 0000)  GBS: Negative/-- (12/16 0000)   Hgb 12.7/Plt 294/Ur Cx +/Chl neg/GC neg/Varicella immune/Hep C neg/glucola 128   Assessment/Plan: 24yo [id number] s/p SVD. Routine PP care  Kristen Patterson 12/07/2019,  8:19 AM

## 2019-12-07 NOTE — L&D Delivery Note (Signed)
Delivery Note After ROM for minimal clear fluid. Pt pushed with one contraction for delivery.  At 7:57 AM a viable and healthy female was delivered via Vaginal, Spontaneous (Presentation: OA, ROT).  APGAR: 9, 9; weight P .   Placenta status: Spontaneous, Intact.  Cord: 3 vessels with the following complications: None.    Anesthesia: Epidural Episiotomy:  N/A Lacerations: 1st degree;Perineal Suture Repair: vicryl rapide 4.0 Est. Blood Loss (mL): 25cc  Mom to postpartum.  Baby to Couplet care / Skin to Skin.  Kristen Patterson 12/07/2019, 8:15 AM

## 2019-12-07 NOTE — MAU Note (Signed)
Contractions every 4-5 min apart since 4 am .  No bleeding. No leaking. Baby moving well earlier but hasn't felt since when ctxs started at 4 am.

## 2019-12-07 NOTE — Anesthesia Procedure Notes (Signed)
Epidural Patient location during procedure: OB Start time: 12/07/2019 7:11 AM End time: 12/07/2019 7:26 AM  Staffing Anesthesiologist: Bethena Midget, MD  Preanesthetic Checklist Completed: patient identified, IV checked, site marked, risks and benefits discussed, surgical consent, monitors and equipment checked, pre-op evaluation and timeout performed  Epidural Patient position: sitting Prep: DuraPrep and site prepped and draped Patient monitoring: continuous pulse ox and blood pressure Approach: midline Location: L3-L4 Injection technique: LOR air  Needle:  Needle type: Tuohy  Needle gauge: 17 G Needle length: 9 cm and 9 Needle insertion depth: 6 cm Catheter type: closed end flexible Catheter size: 19 Gauge Catheter at skin depth: 11 cm Test dose: negative  Assessment Events: blood not aspirated, injection not painful, no injection resistance, no paresthesia and negative IV test

## 2019-12-07 NOTE — MAU Note (Addendum)
Covid swab obtained without difficulty and pt tol well. No symptoms. Positive covid test 10/12/2019

## 2019-12-07 NOTE — Anesthesia Postprocedure Evaluation (Signed)
Anesthesia Post Note  Patient: Kristen Patterson  Procedure(s) Performed: AN AD HOC LABOR EPIDURAL     Patient location during evaluation: Mother Baby Anesthesia Type: Epidural Level of consciousness: awake and alert Pain management: pain level controlled Vital Signs Assessment: post-procedure vital signs reviewed and stable Respiratory status: spontaneous breathing, nonlabored ventilation and respiratory function stable Cardiovascular status: stable Postop Assessment: no headache, no backache and epidural receding Anesthetic complications: no    Last Vitals:  Vitals:   12/07/19 0947 12/07/19 1052  BP: 126/73 134/80  Pulse: 65 65  Resp: (!) 1   Temp:    SpO2:      Last Pain:  Vitals:   12/07/19 1052  TempSrc: Oral  PainSc:    Pain Goal: Patients Stated Pain Goal: 0 (12/07/19 0602)                 Clydette Privitera

## 2019-12-08 LAB — CBC
HCT: 33 % — ABNORMAL LOW (ref 36.0–46.0)
Hemoglobin: 11.3 g/dL — ABNORMAL LOW (ref 12.0–15.0)
MCH: 31.8 pg (ref 26.0–34.0)
MCHC: 34.2 g/dL (ref 30.0–36.0)
MCV: 93 fL (ref 80.0–100.0)
Platelets: 195 10*3/uL (ref 150–400)
RBC: 3.55 MIL/uL — ABNORMAL LOW (ref 3.87–5.11)
RDW: 13 % (ref 11.5–15.5)
WBC: 7.9 10*3/uL (ref 4.0–10.5)
nRBC: 0 % (ref 0.0–0.2)

## 2019-12-08 MED ORDER — IBUPROFEN 600 MG PO TABS: 600.0000 mg | ORAL_TABLET | Freq: Four times a day (QID) | ORAL | 0 refills | Status: DC

## 2019-12-08 MED ORDER — ACETAMINOPHEN 325 MG PO TABS: 650.0000 mg | ORAL_TABLET | ORAL | 0 refills | Status: AC | PRN

## 2019-12-08 NOTE — Discharge Summary (Signed)
OB Discharge Summary     Patient Name: Kristen Patterson DOB: 1996-10-09 MRN: [id number]  Date of admission: 12/07/2019 Delivering MD: Janyth Contes   Date of discharge: 12/08/2019  Admitting diagnosis: Normal labor [O80, Z37.9] Intrauterine pregnancy: [redacted]w[redacted]d     Secondary diagnosis:  Principal Problem:   SVD (spontaneous vaginal delivery) Active Problems:   Normal labor Precipitous delivery  Additional problems: none     Discharge diagnosis: Term Pregnancy Delivered                                                                                                Post partum procedures:none  Augmentation: none  Complications: None  Hospital course:  Onset of Labor With Vaginal Delivery     24 y.o. yo G5P5006 at [redacted]w[redacted]d was admitted in Active Labor on 12/07/2019. Patient had a precipitous labor course as follows:  Membrane Rupture Time/Date: 7:55 AM ,12/07/2019   Intrapartum Procedures: Episiotomy:                                          Lacerations:  1st degree [2];Perineal [11]  Patient had a delivery of a Viable infant. 12/07/2019  Information for the patient's newborn:  Aliviyah, Malanga [[id number]]  Delivery Method: Vaginal, Spontaneous(Filed from Delivery Summary)     Pateint had an uncomplicated postpartum course.  She is ambulating, tolerating a regular diet, passing flatus, and urinating well. Patient is discharged home in stable condition on 12/08/19.   Physical exam  Vitals:   12/07/19 1646 12/07/19 2011 12/08/19 0024 12/08/19 0419  BP: 130/85 (!) 131/95 136/67 129/80  Pulse: 70 71 78 78  Resp: 20 18 18 16   Temp: 98.9 F (37.2 C) 98 F (36.7 C) 97.6 F (36.4 C) 98 F (36.7 C)  TempSrc:  Oral Oral Oral  SpO2: 99% 99% 99% 99%  Weight:      Height:       General: alert and cooperative Lochia: appropriate Uterine Fundus: firm  Labs: Lab Results  Component Value Date   WBC 7.9 12/08/2019   HGB 11.3 (L) 12/08/2019   HCT 33.0 (L) 12/08/2019   MCV  93.0 12/08/2019   PLT 195 12/08/2019   CMP Latest Ref Rng & Units 11/04/2018  Glucose 70 - 99 mg/dL 94  BUN 6 - 20 mg/dL 9  Creatinine 0.44 - 1.00 mg/dL 0.45  Sodium 135 - 145 mmol/L 132(L)  Potassium 3.5 - 5.1 mmol/L 4.2  Chloride 98 - 111 mmol/L 103  CO2 22 - 32 mmol/L 16(L)  Calcium 8.9 - 10.3 mg/dL 8.1(L)  Total Protein 6.5 - 8.1 g/dL 6.3(L)  Total Bilirubin 0.3 - 1.2 mg/dL 1.2  Alkaline Phos 38 - 126 U/L 156(H)  AST 15 - 41 U/L 22  ALT 0 - 44 U/L 9    Discharge instruction: per After Visit Summary and "Baby and Me Booklet".  After visit meds:  Allergies as of 12/08/2019   No Known Allergies     Medication List  TAKE these medications   acetaminophen 325 MG tablet Commonly known as: Tylenol Take 2 tablets (650 mg total) by mouth every 4 (four) hours as needed (for pain scale < 4).   cetirizine 10 MG tablet Commonly known as: ZYRTEC Take 10 mg by mouth daily.   hydrOXYzine 25 MG tablet Commonly known as: ATARAX/VISTARIL Take 1 tablet (25 mg total) by mouth every 6 (six) hours.   ibuprofen 600 MG tablet Commonly known as: ADVIL Take 1 tablet (600 mg total) by mouth every 6 (six) hours.       Diet: routine diet  Activity: Advance as tolerated. Pelvic rest for 6 weeks.   Outpatient follow up:6 weeks Follow up Appt:No future appointments. Follow up Visit:No follow-ups on file.  Postpartum contraception: Undecided  Newborn Data: Live born female  Birth Weight: 7 lb 14.5 oz (3586 g) APGAR: 9, 9  Newborn Delivery   Birth date/time: 12/07/2019 07:57:00 Delivery type: Vaginal, Spontaneous      Baby Feeding: Breast Disposition:home with mother Plans circumcision in the office  12/08/2019 Oliver Pila, MD

## 2019-12-08 NOTE — Progress Notes (Signed)
Post Partum Day 1 Subjective: no complaints, up ad lib and tolerating PO  Requesting early d/c and baby cleared by peds to go.  Objective: Blood pressure 129/80, pulse 78, temperature 98 F (36.7 C), temperature source Oral, resp. rate 16, height 5\' 9"  (1.753 m), weight 109 kg, last menstrual period 04/06/2019, SpO2 99 %, unknown if currently breastfeeding.  Physical Exam:  General: alert and cooperative Lochia: appropriate   Recent Labs    12/07/19 0630 12/08/19 0539  HGB 12.6 11.3*  HCT 37.2 33.0*    Assessment/Plan: Discharge home   LOS: 1 day   02/05/20 12/08/2019, 9:49 AM

## 2019-12-08 NOTE — Progress Notes (Signed)
MOB was referred for history of depression/anxiety.  * Referral screened out by Clinical Social Worker because none of the following criteria appear to apply:  ~ History of anxiety/depression during this pregnancy, or of post-partum depression following prior delivery. ~ Diagnosis of anxiety and/or depression within last 3 years OR * MOB's symptoms currently being treated with medication and/or therapy. MOB currently prescribed Zoloft. No concerns noted in [facility] records.  Please contact the Clinical Social Worker if needs arise or by MOB request.  Lear Ng, LCSW [facility] [phone]

## 2019-12-10 ENCOUNTER — Inpatient Hospital Stay (HOSPITAL_COMMUNITY)
Admission: AD | Admit: 2019-12-10 | Payer: Federal, State, Local not specified - PPO | Source: Home / Self Care | Admitting: Obstetrics and Gynecology

## 2019-12-10 ENCOUNTER — Inpatient Hospital Stay (HOSPITAL_COMMUNITY): Payer: Federal, State, Local not specified - PPO

## 2019-12-10 ENCOUNTER — Encounter (HOSPITAL_COMMUNITY): Payer: Self-pay | Admitting: Obstetrics and Gynecology

## 2020-07-14 DIAGNOSIS — Z3490 Encounter for supervision of normal pregnancy, unspecified, unspecified trimester: Secondary | ICD-10-CM | POA: Insufficient documentation

## 2020-07-14 DIAGNOSIS — R8290 Unspecified abnormal findings in urine: Secondary | ICD-10-CM | POA: Diagnosis not present

## 2020-07-14 DIAGNOSIS — N911 Secondary amenorrhea: Secondary | ICD-10-CM | POA: Diagnosis not present

## 2020-07-14 DIAGNOSIS — Z3201 Encounter for pregnancy test, result positive: Secondary | ICD-10-CM | POA: Diagnosis not present

## 2020-07-24 DIAGNOSIS — R309 Painful micturition, unspecified: Secondary | ICD-10-CM | POA: Diagnosis not present

## 2020-08-05 DIAGNOSIS — Z3689 Encounter for other specified antenatal screening: Secondary | ICD-10-CM | POA: Diagnosis not present

## 2020-08-05 DIAGNOSIS — Z369 Encounter for antenatal screening, unspecified: Secondary | ICD-10-CM | POA: Diagnosis not present

## 2020-08-05 DIAGNOSIS — O0941 Supervision of pregnancy with grand multiparity, first trimester: Secondary | ICD-10-CM | POA: Diagnosis not present

## 2020-08-05 DIAGNOSIS — Z3A10 10 weeks gestation of pregnancy: Secondary | ICD-10-CM | POA: Diagnosis not present

## 2020-08-05 DIAGNOSIS — Z113 Encounter for screening for infections with a predominantly sexual mode of transmission: Secondary | ICD-10-CM | POA: Diagnosis not present

## 2020-08-05 DIAGNOSIS — O26891 Other specified pregnancy related conditions, first trimester: Secondary | ICD-10-CM | POA: Diagnosis not present

## 2020-08-05 LAB — OB RESULTS CONSOLE RUBELLA ANTIBODY, IGM: Rubella: IMMUNE

## 2020-08-05 LAB — OB RESULTS CONSOLE HIV ANTIBODY (ROUTINE TESTING): HIV: NONREACTIVE

## 2020-08-05 LAB — OB RESULTS CONSOLE HEPATITIS B SURFACE ANTIGEN: Hepatitis B Surface Ag: NEGATIVE

## 2020-09-04 DIAGNOSIS — Z3A15 15 weeks gestation of pregnancy: Secondary | ICD-10-CM | POA: Diagnosis not present

## 2020-09-04 DIAGNOSIS — Z23 Encounter for immunization: Secondary | ICD-10-CM | POA: Diagnosis not present

## 2020-09-04 DIAGNOSIS — O99342 Other mental disorders complicating pregnancy, second trimester: Secondary | ICD-10-CM | POA: Diagnosis not present

## 2020-09-04 DIAGNOSIS — O0942 Supervision of pregnancy with grand multiparity, second trimester: Secondary | ICD-10-CM | POA: Diagnosis not present

## 2020-09-04 DIAGNOSIS — Z7189 Other specified counseling: Secondary | ICD-10-CM | POA: Diagnosis not present

## 2020-10-01 DIAGNOSIS — O99342 Other mental disorders complicating pregnancy, second trimester: Secondary | ICD-10-CM | POA: Diagnosis not present

## 2020-10-01 DIAGNOSIS — Z7189 Other specified counseling: Secondary | ICD-10-CM | POA: Diagnosis not present

## 2020-10-01 DIAGNOSIS — Z369 Encounter for antenatal screening, unspecified: Secondary | ICD-10-CM | POA: Diagnosis not present

## 2020-10-01 DIAGNOSIS — O0942 Supervision of pregnancy with grand multiparity, second trimester: Secondary | ICD-10-CM | POA: Diagnosis not present

## 2020-10-01 DIAGNOSIS — Z3A18 18 weeks gestation of pregnancy: Secondary | ICD-10-CM | POA: Diagnosis not present

## 2020-10-17 DIAGNOSIS — O99342 Other mental disorders complicating pregnancy, second trimester: Secondary | ICD-10-CM | POA: Diagnosis not present

## 2020-10-17 DIAGNOSIS — O0942 Supervision of pregnancy with grand multiparity, second trimester: Secondary | ICD-10-CM | POA: Diagnosis not present

## 2020-10-17 DIAGNOSIS — Z3A21 21 weeks gestation of pregnancy: Secondary | ICD-10-CM | POA: Diagnosis not present

## 2020-10-17 DIAGNOSIS — L292 Pruritus vulvae: Secondary | ICD-10-CM | POA: Diagnosis not present

## 2020-10-29 DIAGNOSIS — Z362 Encounter for other antenatal screening follow-up: Secondary | ICD-10-CM | POA: Diagnosis not present

## 2020-10-29 DIAGNOSIS — Z3A22 22 weeks gestation of pregnancy: Secondary | ICD-10-CM | POA: Diagnosis not present

## 2020-10-29 DIAGNOSIS — O0940 Supervision of pregnancy with grand multiparity, unspecified trimester: Secondary | ICD-10-CM | POA: Diagnosis not present

## 2020-12-06 NOTE — L&D Delivery Note (Signed)
DELIVERY NOTE  Pt complete and at +2 station with urge to push. Epidural controlling pain. Pt pushed and delivered a viable female infant in ROA position. Double nuchal reduced at perineum without issue. Anterior and posterior shoulders spontaneously delivered with next two pushes; body easily followed next. Infant placed on mothers abdomen and bulb suction of mouth and nose performed. Cord was then clamped and cut by FOB. Cord blood obtained, 3VC. Baby had a vigorous spontaneous cry noted. Placenta then delivered at 0737 intact. Fundal massage performed and pitocin per protocol. Fundus firm. The following lacerations were noted: 1st degree. Repaired in routine fashion with 3-0 vicryl in figure of eight. Mother and baby stable. Counts correct. EBL 300cc  Infant time: 33 Gender: female Placenta time: 0737 Apgars: 9/9 Weight: pending skin-to-skin

## 2020-12-10 DIAGNOSIS — Z3689 Encounter for other specified antenatal screening: Secondary | ICD-10-CM | POA: Diagnosis not present

## 2020-12-10 DIAGNOSIS — O99343 Other mental disorders complicating pregnancy, third trimester: Secondary | ICD-10-CM | POA: Diagnosis not present

## 2020-12-10 DIAGNOSIS — O0943 Supervision of pregnancy with grand multiparity, third trimester: Secondary | ICD-10-CM | POA: Diagnosis not present

## 2020-12-10 DIAGNOSIS — Z3A28 28 weeks gestation of pregnancy: Secondary | ICD-10-CM | POA: Diagnosis not present

## 2020-12-10 DIAGNOSIS — Z23 Encounter for immunization: Secondary | ICD-10-CM | POA: Diagnosis not present

## 2020-12-10 LAB — OB RESULTS CONSOLE GC/CHLAMYDIA
Chlamydia: NEGATIVE
Gonorrhea: NEGATIVE

## 2021-01-28 DIAGNOSIS — O321XX1 Maternal care for breech presentation, fetus 1: Secondary | ICD-10-CM | POA: Diagnosis not present

## 2021-01-28 DIAGNOSIS — O0943 Supervision of pregnancy with grand multiparity, third trimester: Secondary | ICD-10-CM | POA: Diagnosis not present

## 2021-01-28 DIAGNOSIS — O99342 Other mental disorders complicating pregnancy, second trimester: Secondary | ICD-10-CM | POA: Diagnosis not present

## 2021-01-28 DIAGNOSIS — O321XX0 Maternal care for breech presentation, not applicable or unspecified: Secondary | ICD-10-CM

## 2021-01-28 DIAGNOSIS — Z3A35 35 weeks gestation of pregnancy: Secondary | ICD-10-CM | POA: Diagnosis not present

## 2021-01-28 DIAGNOSIS — Z3685 Encounter for antenatal screening for Streptococcus B: Secondary | ICD-10-CM | POA: Diagnosis not present

## 2021-01-28 HISTORY — DX: Maternal care for breech presentation, not applicable or unspecified: O32.1XX0

## 2021-01-28 LAB — OB RESULTS CONSOLE GBS: GBS: POSITIVE

## 2021-01-29 ENCOUNTER — Encounter (HOSPITAL_COMMUNITY): Payer: Self-pay | Admitting: *Deleted

## 2021-01-29 ENCOUNTER — Telehealth (HOSPITAL_COMMUNITY): Payer: Self-pay | Admitting: *Deleted

## 2021-01-29 NOTE — Telephone Encounter (Signed)
Preadmission screen  

## 2021-02-04 ENCOUNTER — Other Ambulatory Visit (HOSPITAL_COMMUNITY)
Admission: RE | Admit: 2021-02-04 | Discharge: 2021-02-04 | Disposition: A | Discharge status: Home / Self Care | Payer: Medicaid Other | Source: Ambulatory Visit | Attending: Obstetrics and Gynecology | Admitting: Obstetrics and Gynecology

## 2021-02-04 DIAGNOSIS — Z01812 Encounter for preprocedural laboratory examination: Secondary | ICD-10-CM | POA: Insufficient documentation

## 2021-02-04 DIAGNOSIS — Z20822 Contact with and (suspected) exposure to covid-19: Secondary | ICD-10-CM | POA: Insufficient documentation

## 2021-02-04 LAB — SARS CORONAVIRUS 2 (TAT 6-24 HRS): SARS Coronavirus 2: NEGATIVE

## 2021-02-06 ENCOUNTER — Observation Stay (HOSPITAL_COMMUNITY)
Admission: AD | Admit: 2021-02-06 | Payer: Medicaid Other | Source: Home / Self Care | Admitting: Obstetrics and Gynecology

## 2021-02-06 ENCOUNTER — Observation Stay (HOSPITAL_COMMUNITY): Admission: RE | Admit: 2021-02-06 | Payer: Medicaid Other | Source: Ambulatory Visit

## 2021-02-11 DIAGNOSIS — O133 Gestational [pregnancy-induced] hypertension without significant proteinuria, third trimester: Secondary | ICD-10-CM | POA: Diagnosis not present

## 2021-02-11 DIAGNOSIS — Z3A37 37 weeks gestation of pregnancy: Secondary | ICD-10-CM | POA: Diagnosis not present

## 2021-02-12 ENCOUNTER — Inpatient Hospital Stay (HOSPITAL_COMMUNITY)
Admission: AD | Admit: 2021-02-12 | Discharge: 2021-02-14 | DRG: 807 | Disposition: A | Discharge status: Home / Self Care | Payer: Medicaid Other | Attending: Obstetrics and Gynecology | Admitting: Obstetrics and Gynecology

## 2021-02-12 ENCOUNTER — Encounter (HOSPITAL_COMMUNITY): Payer: Self-pay | Admitting: Obstetrics and Gynecology

## 2021-02-12 DIAGNOSIS — Z20822 Contact with and (suspected) exposure to covid-19: Secondary | ICD-10-CM | POA: Diagnosis present

## 2021-02-12 DIAGNOSIS — F32A Depression, unspecified: Secondary | ICD-10-CM | POA: Diagnosis present

## 2021-02-12 DIAGNOSIS — F419 Anxiety disorder, unspecified: Secondary | ICD-10-CM | POA: Diagnosis present

## 2021-02-12 DIAGNOSIS — O134 Gestational [pregnancy-induced] hypertension without significant proteinuria, complicating childbirth: Principal | ICD-10-CM | POA: Diagnosis present

## 2021-02-12 DIAGNOSIS — O99344 Other mental disorders complicating childbirth: Secondary | ICD-10-CM | POA: Diagnosis present

## 2021-02-12 DIAGNOSIS — Z3A38 38 weeks gestation of pregnancy: Secondary | ICD-10-CM

## 2021-02-12 DIAGNOSIS — O99824 Streptococcus B carrier state complicating childbirth: Secondary | ICD-10-CM | POA: Diagnosis present

## 2021-02-12 DIAGNOSIS — O133 Gestational [pregnancy-induced] hypertension without significant proteinuria, third trimester: Secondary | ICD-10-CM | POA: Diagnosis present

## 2021-02-12 LAB — COMPREHENSIVE METABOLIC PANEL
ALT: 9 U/L (ref 0–44)
AST: 17 U/L (ref 15–41)
Albumin: 2.4 g/dL — ABNORMAL LOW (ref 3.5–5.0)
Alkaline Phosphatase: 172 U/L — ABNORMAL HIGH (ref 38–126)
Anion gap: 8 (ref 5–15)
BUN: 7 mg/dL (ref 6–20)
CO2: 19 mmol/L — ABNORMAL LOW (ref 22–32)
Calcium: 8.7 mg/dL — ABNORMAL LOW (ref 8.9–10.3)
Chloride: 107 mmol/L (ref 98–111)
Creatinine, Ser: 0.51 mg/dL (ref 0.44–1.00)
GFR, Estimated: >60 mL/min (ref >60)
Glucose, Bld: 112 mg/dL — ABNORMAL HIGH (ref 70–99)
Potassium: 3.8 mmol/L (ref 3.5–5.1)
Sodium: 134 mmol/L — ABNORMAL LOW (ref 135–145)
Total Bilirubin: 0.6 mg/dL (ref 0.3–1.2)
Total Protein: 5.7 g/dL — ABNORMAL LOW (ref 6.5–8.1)

## 2021-02-12 LAB — TYPE AND SCREEN
ABO/RH(D): A POS
Antibody Screen: NEGATIVE

## 2021-02-12 LAB — PROTEIN / CREATININE RATIO, URINE
Creatinine, Urine: 91.64 mg/dL
Protein Creatinine Ratio: 0.19 mg/mg{Cre} — ABNORMAL HIGH (ref 0.00–0.15)
Total Protein, Urine: 17 mg/dL

## 2021-02-12 LAB — CBC
HCT: 31.1 % — ABNORMAL LOW (ref 36.0–46.0)
Hemoglobin: 10.1 g/dL — ABNORMAL LOW (ref 12.0–15.0)
MCH: 28.5 pg (ref 26.0–34.0)
MCHC: 32.5 g/dL (ref 30.0–36.0)
MCV: 87.6 fL (ref 80.0–100.0)
Platelets: 255 10*3/uL (ref 150–400)
RBC: 3.55 MIL/uL — ABNORMAL LOW (ref 3.87–5.11)
RDW: 13.2 % (ref 11.5–15.5)
WBC: 8.4 10*3/uL (ref 4.0–10.5)
nRBC: 0 % (ref 0.0–0.2)

## 2021-02-12 MED ORDER — LABETALOL HCL 5 MG/ML IV SOLN: 40.0000 mg | INTRAVENOUS | Status: DC | PRN

## 2021-02-12 MED ORDER — LABETALOL HCL 5 MG/ML IV SOLN
20.0000 mg | INTRAVENOUS | Status: DC | PRN
Start: 2021-02-12 — End: 2021-02-13

## 2021-02-12 MED ORDER — HYDRALAZINE HCL 20 MG/ML IJ SOLN: 10.0000 mg | INTRAMUSCULAR | Status: DC | PRN

## 2021-02-12 MED ORDER — LABETALOL HCL 5 MG/ML IV SOLN: 80.0000 mg | INTRAVENOUS | Status: DC | PRN

## 2021-02-12 NOTE — MAU Provider Note (Signed)
Event Date/Time   First Provider Initiated Contact with Patient 02/12/21 2217     S: Ms. Kristen Patterson is a 25 y.o. G6P5006 at [redacted]w[redacted]d  who presents to MAU today complaining contractions q 5 minutes since 2100 hours. She denies vaginal bleeding. She denies LOF. She reports normal fetal movement.    O: BP (!) 144/92   Pulse 92   Temp 98.4 F (36.9 C) (Oral)   Resp 20   Wt 119.5 kg   SpO2 96%   BMI 38.89 kg/m    Patient Vitals for the past 24 hrs:  BP Temp Temp src Pulse Resp SpO2 Weight  02/12/21 2346 138/89 -- -- 91 -- -- --  02/12/21 2331 123/82 -- -- 93 -- -- --  02/12/21 2315 135/88 -- -- 87 -- -- --  02/12/21 2300 124/86 -- -- 93 -- -- --  02/12/21 2245 (!) 141/90 -- -- 91 -- -- --  02/12/21 2230 (!) 143/92 -- -- 96 -- -- --  02/12/21 2215 (!) 144/92 -- -- 92 -- 96 % --  02/12/21 2204 (!) 150/95 98.4 F (36.9 C) Oral 80 20 96 % --  02/12/21 2200 (!) 150/95 -- -- 80 -- 98 % --  02/12/21 2154 -- -- -- -- -- -- 119.5 kg    GENERAL: Well-developed, well-nourished female in no acute distress.  HEAD: Normocephalic, atraumatic.  CHEST: Normal effort of breathing, regular heart rate ABDOMEN: Soft, nontender, gravid  Cervical exam:  Dilation: 3 Effacement (%): Thick Cervical Position: Posterior Station: -3 Presentation: Vertex Exam by:: N. Risheq, RN   Fetal Monitoring: Baseline: 140 Variability: Mod Accelerations: 15 x 15 Decelerations: None Contractions: Irregular q 2-5 min   A: SIUP at [redacted]w[redacted]d  Prenatal records visible, reviewed by CNM Cat I tracing Cervix unchanged from office yesterday New onset HTN in office yesterday Mild BP elevation in MAU, PEC labs added to admission labs Patient now meets criteria for GHTN Dr. Jackelyn Knife notified  P: Per Dr. Jackelyn Knife, admit to L&D  Clayton Bibles, CNM 02/13/2021 12:03 AM

## 2021-02-12 NOTE — MAU Note (Signed)
Pt presents to MAU for CTX becoming more uncomfortable around 2100, coming every 5 min per pt.  Pt denies LOF or vaginal bleeding.  Pt endorses +FM.  Pt also notes tingling in vaginal area beginning today.

## 2021-02-13 ENCOUNTER — Encounter (HOSPITAL_COMMUNITY): Payer: Self-pay | Admitting: Obstetrics and Gynecology

## 2021-02-13 ENCOUNTER — Inpatient Hospital Stay (HOSPITAL_COMMUNITY): Payer: Medicaid Other | Admitting: Anesthesiology

## 2021-02-13 ENCOUNTER — Other Ambulatory Visit: Payer: Self-pay

## 2021-02-13 DIAGNOSIS — Z20822 Contact with and (suspected) exposure to covid-19: Secondary | ICD-10-CM | POA: Diagnosis not present

## 2021-02-13 DIAGNOSIS — R030 Elevated blood-pressure reading, without diagnosis of hypertension: Secondary | ICD-10-CM | POA: Diagnosis not present

## 2021-02-13 DIAGNOSIS — O133 Gestational [pregnancy-induced] hypertension without significant proteinuria, third trimester: Secondary | ICD-10-CM | POA: Diagnosis not present

## 2021-02-13 DIAGNOSIS — Z3A38 38 weeks gestation of pregnancy: Secondary | ICD-10-CM

## 2021-02-13 DIAGNOSIS — F419 Anxiety disorder, unspecified: Secondary | ICD-10-CM | POA: Diagnosis not present

## 2021-02-13 DIAGNOSIS — O99824 Streptococcus B carrier state complicating childbirth: Secondary | ICD-10-CM | POA: Diagnosis not present

## 2021-02-13 DIAGNOSIS — O99344 Other mental disorders complicating childbirth: Secondary | ICD-10-CM | POA: Diagnosis not present

## 2021-02-13 DIAGNOSIS — O134 Gestational [pregnancy-induced] hypertension without significant proteinuria, complicating childbirth: Secondary | ICD-10-CM | POA: Diagnosis not present

## 2021-02-13 DIAGNOSIS — F32A Depression, unspecified: Secondary | ICD-10-CM | POA: Diagnosis not present

## 2021-02-13 HISTORY — DX: Gestational (pregnancy-induced) hypertension without significant proteinuria, third trimester: O13.3

## 2021-02-13 LAB — RPR: RPR Ser Ql: NONREACTIVE

## 2021-02-13 LAB — RESP PANEL BY RT-PCR (FLU A&B, COVID) ARPGX2
Influenza A by PCR: NEGATIVE
Influenza B by PCR: NEGATIVE
SARS Coronavirus 2 by RT PCR: NEGATIVE

## 2021-02-13 MED ORDER — LIDOCAINE HCL (PF) 1 % IJ SOLN: 30.0000 mL | INTRAMUSCULAR | Status: DC | PRN

## 2021-02-13 MED ORDER — DIPHENHYDRAMINE HCL 25 MG PO CAPS: 25.0000 mg | ORAL_CAPSULE | Freq: Four times a day (QID) | ORAL | Status: DC | PRN

## 2021-02-13 MED ORDER — ZOLPIDEM TARTRATE 5 MG PO TABS: 5.0000 mg | ORAL_TABLET | Freq: Every evening | ORAL | Status: DC | PRN

## 2021-02-13 MED ORDER — DIPHENHYDRAMINE HCL 50 MG/ML IJ SOLN: 12.5000 mg | INTRAMUSCULAR | Status: DC | PRN

## 2021-02-13 MED ORDER — PHENYLEPHRINE 40 MCG/ML (10ML) SYRINGE FOR IV PUSH (FOR BLOOD PRESSURE SUPPORT): 80.0000 ug | PREFILLED_SYRINGE | INTRAVENOUS | Status: DC | PRN

## 2021-02-13 MED ORDER — PHENYLEPHRINE 40 MCG/ML (10ML) SYRINGE FOR IV PUSH (FOR BLOOD PRESSURE SUPPORT)
80.0000 ug | PREFILLED_SYRINGE | INTRAVENOUS | Status: DC | PRN
  Filled 2021-02-13: qty 10

## 2021-02-13 MED ORDER — EPHEDRINE 5 MG/ML INJ: 10.0000 mg | INTRAVENOUS | Status: DC | PRN

## 2021-02-13 MED ORDER — OXYCODONE-ACETAMINOPHEN 5-325 MG PO TABS: 2.0000 | ORAL_TABLET | ORAL | Status: DC | PRN

## 2021-02-13 MED ORDER — SIMETHICONE 80 MG PO CHEW: 80.0000 mg | CHEWABLE_TABLET | ORAL | Status: DC | PRN

## 2021-02-13 MED ORDER — ACETAMINOPHEN 325 MG PO TABS: 650.0000 mg | ORAL_TABLET | ORAL | Status: DC | PRN

## 2021-02-13 MED ORDER — PRENATAL MULTIVITAMIN CH
1.0000 | ORAL_TABLET | Freq: Every day | ORAL | Status: DC
  Administered 2021-02-13: 1 via ORAL
  Filled 2021-02-13: qty 1

## 2021-02-13 MED ORDER — OXYCODONE-ACETAMINOPHEN 5-325 MG PO TABS: 1.0000 | ORAL_TABLET | ORAL | Status: DC | PRN

## 2021-02-13 MED ORDER — IBUPROFEN 600 MG PO TABS
600.0000 mg | ORAL_TABLET | Freq: Four times a day (QID) | ORAL | Status: DC
  Administered 2021-02-13 – 2021-02-14 (×4): 600 mg via ORAL
  Filled 2021-02-13 (×4): qty 1

## 2021-02-13 MED ORDER — LIDOCAINE HCL (PF) 1 % IJ SOLN
INTRAMUSCULAR | Status: DC | PRN
  Administered 2021-02-13: 5 mL via EPIDURAL

## 2021-02-13 MED ORDER — DIBUCAINE (PERIANAL) 1 % EX OINT: 1.0000 "application " | TOPICAL_OINTMENT | CUTANEOUS | Status: DC | PRN

## 2021-02-13 MED ORDER — SOD CITRATE-CITRIC ACID 500-334 MG/5ML PO SOLN: 30.0000 mL | ORAL | Status: DC | PRN

## 2021-02-13 MED ORDER — SODIUM CHLORIDE 0.9 % IV SOLN
5.0000 10*6.[IU] | Freq: Once | INTRAVENOUS | Status: AC
  Administered 2021-02-13: 5 10*6.[IU] via INTRAVENOUS
  Filled 2021-02-13: qty 5

## 2021-02-13 MED ORDER — ONDANSETRON HCL 4 MG PO TABS
4.0000 mg | ORAL_TABLET | ORAL | Status: DC | PRN
Start: 2021-02-13 — End: 2021-02-14

## 2021-02-13 MED ORDER — LACTATED RINGERS IV SOLN: 500.0000 mL | Freq: Once | INTRAVENOUS | Status: DC

## 2021-02-13 MED ORDER — COCONUT OIL OIL: 1.0000 "application " | TOPICAL_OIL | Status: DC | PRN

## 2021-02-13 MED ORDER — SENNOSIDES-DOCUSATE SODIUM 8.6-50 MG PO TABS: 2.0000 | ORAL_TABLET | Freq: Every day | ORAL | Status: DC

## 2021-02-13 MED ORDER — FENTANYL-BUPIVACAINE-NACL 0.5-0.125-0.9 MG/250ML-% EP SOLN
EPIDURAL | Status: DC | PRN
  Administered 2021-02-13: 12 mL/h via EPIDURAL

## 2021-02-13 MED ORDER — ONDANSETRON HCL 4 MG/2ML IJ SOLN: 4.0000 mg | Freq: Four times a day (QID) | INTRAMUSCULAR | Status: DC | PRN

## 2021-02-13 MED ORDER — WITCH HAZEL-GLYCERIN EX PADS: 1.0000 "application " | MEDICATED_PAD | CUTANEOUS | Status: DC | PRN

## 2021-02-13 MED ORDER — SERTRALINE HCL 100 MG PO TABS
100.0000 mg | ORAL_TABLET | Freq: Every day | ORAL | Status: DC
  Administered 2021-02-14: 100 mg via ORAL
  Filled 2021-02-13: qty 1

## 2021-02-13 MED ORDER — OXYTOCIN-SODIUM CHLORIDE 30-0.9 UT/500ML-% IV SOLN
2.5000 [IU]/h | INTRAVENOUS | Status: DC
  Filled 2021-02-13: qty 500

## 2021-02-13 MED ORDER — BENZOCAINE-MENTHOL 20-0.5 % EX AERO
1.0000 "application " | INHALATION_SPRAY | CUTANEOUS | Status: DC | PRN
  Administered 2021-02-13: 1 via TOPICAL
  Filled 2021-02-13: qty 56

## 2021-02-13 MED ORDER — FENTANYL-BUPIVACAINE-NACL 0.5-0.125-0.9 MG/250ML-% EP SOLN
12.0000 mL/h | EPIDURAL | Status: DC | PRN
  Filled 2021-02-13: qty 250

## 2021-02-13 MED ORDER — PENICILLIN G POT IN DEXTROSE 60000 UNIT/ML IV SOLN
3.0000 10*6.[IU] | INTRAVENOUS | Status: DC
  Administered 2021-02-13: 3 10*6.[IU] via INTRAVENOUS
  Filled 2021-02-13: qty 50

## 2021-02-13 MED ORDER — BUTORPHANOL TARTRATE 1 MG/ML IJ SOLN: 1.0000 mg | INTRAMUSCULAR | Status: DC | PRN

## 2021-02-13 MED ORDER — OXYTOCIN BOLUS FROM INFUSION
333.0000 mL | Freq: Once | INTRAVENOUS | Status: AC
  Administered 2021-02-13: 333 mL via INTRAVENOUS

## 2021-02-13 MED ORDER — TETANUS-DIPHTH-ACELL PERTUSSIS 5-2.5-18.5 LF-MCG/0.5 IM SUSY: 0.5000 mL | PREFILLED_SYRINGE | Freq: Once | INTRAMUSCULAR | Status: DC

## 2021-02-13 MED ORDER — LACTATED RINGERS IV SOLN: INTRAVENOUS | Status: DC

## 2021-02-13 MED ORDER — LACTATED RINGERS IV SOLN: 500.0000 mL | INTRAVENOUS | Status: DC | PRN

## 2021-02-13 MED ORDER — ONDANSETRON HCL 4 MG/2ML IJ SOLN: 4.0000 mg | INTRAMUSCULAR | Status: DC | PRN

## 2021-02-13 NOTE — Plan of Care (Signed)
  Problem: Education: Goal: Knowledge of General Education information will improve Description: Including pain rating scale, medication(s)/side effects and non-pharmacologic comfort measures Outcome: Progressing   Problem: Health Behavior/Discharge Planning: Goal: Ability to manage health-related needs will improve Outcome: Progressing   Problem: Clinical Measurements: Goal: Ability to maintain clinical measurements within normal limits will improve Outcome: Progressing Goal: Will remain free from infection Outcome: Progressing Goal: Diagnostic test results will improve Outcome: Progressing   Problem: Education: Goal: Knowledge of Childbirth will improve Outcome: Progressing Goal: Ability to make informed decisions regarding treatment and plan of care will improve Outcome: Progressing Goal: Ability to state and carry out methods to decrease the pain will improve Outcome: Progressing

## 2021-02-13 NOTE — Progress Notes (Signed)
Comfortable with epidural Afeb, VSS, BP 120-140/70-90 FHT-130s, Cat I VE-5/50/-1, vtx, AROM no fluid  Monitor progress, anticipate SVD

## 2021-02-13 NOTE — Anesthesia Procedure Notes (Signed)
Epidural Patient location during procedure: OB Start time: 02/13/2021 2:30 AM End time: 02/13/2021 2:45 AM  Staffing Anesthesiologist: Trevor Iha, MD Performed: anesthesiologist   Preanesthetic Checklist Completed: patient identified, IV checked, site marked, risks and benefits discussed, surgical consent, monitors and equipment checked, pre-op evaluation and timeout performed  Epidural Patient position: sitting Prep: DuraPrep and site prepped and draped Patient monitoring: continuous pulse ox and blood pressure Approach: midline Location: L2-L3 Injection technique: LOR air  Needle:  Needle type: Tuohy  Needle gauge: 17 G Needle length: 9 cm and 9 Needle insertion depth: 7 cm Catheter type: closed end flexible Catheter size: 19 Gauge Catheter at skin depth: 13 cm Test dose: negative  Assessment Events: blood not aspirated, injection not painful, no injection resistance, no paresthesia and negative IV test  Additional Notes Patient identified. Risks/Benefits/Options discussed with patient including but not limited to bleeding, infection, nerve damage, paralysis, failed block, incomplete pain control, headache, blood pressure changes, nausea, vomiting, reactions to medication both or allergic, itching and postpartum back pain. Confirmed with bedside nurse the patient's most recent platelet count. Confirmed with patient that they are not currently taking any anticoagulation, have any bleeding history or any family history of bleeding disorders. Patient expressed understanding and wished to proceed. All questions were answered. Sterile technique was used throughout the entire procedure. Please see nursing notes for vital signs. Test dose was given through epidural needle and negative prior to continuing to dose epidural or start infusion. Warning signs of high block given to the patient including shortness of breath, tingling/numbness in hands, complete motor block, or any  concerning symptoms with instructions to call for help. Patient was given instructions on fall risk and not to get out of bed. All questions and concerns addressed with instructions to call with any issues. 1 Attempt (S) . Patient tolerated procedure well.

## 2021-02-13 NOTE — Progress Notes (Signed)
Comfortable with epidural Afeb, VSS, BP ok FHT-120s, Cat I, ctx q 2-3 min VE-6/80/0, vtx  Monitor progress, anticipate SVD

## 2021-02-13 NOTE — Social Work (Signed)
MOB was referred for history of depression and anxiety.   * Referral screened out by Clinical Social Worker because none of the following criteria appear to apply:  ~ History of anxiety/depression during this pregnancy, or of post-partum depression following prior delivery. ~ Diagnosis of anxiety and/or depression within last 3 years. OR * MOB's symptoms currently being treated with medication and/or therapy. MOB currently has an active prescription for Zoloft 100mg.  Please contact the Clinical Social Worker if needs arise, by MOB request, or if MOB scores greater than 9/yes to question 10 on Edinburgh Postpartum Depression Screen.  [name], LCSWA Clinical Social Work [facility]  [phone] 

## 2021-02-13 NOTE — Anesthesia Preprocedure Evaluation (Signed)
Anesthesia Evaluation  Patient identified by MRN, date of birth, ID band Patient awake    Reviewed: Allergy & Precautions, NPO status , Patient's Chart, lab work & pertinent test results  Airway Mallampati: II  TM Distance: >3 FB Neck ROM: Full    Dental no notable dental hx. (+) Teeth Intact, Dental Advisory Given   Pulmonary neg pulmonary ROS,    Pulmonary exam normal breath sounds clear to auscultation       Cardiovascular hypertension (gHtn), Normal cardiovascular exam Rhythm:Regular Rate:Normal     Neuro/Psych negative neurological ROS     GI/Hepatic negative GI ROS, Neg liver ROS,   Endo/Other  negative endocrine ROS  Renal/GU negative Renal ROS     Musculoskeletal   Abdominal (+) + obese,   Peds  Hematology  (+) anemia , Lab Results      Component                Value               Date                      WBC                      8.4                 02/12/2021                HGB                      10.1 (L)            02/12/2021                HCT                      31.1 (L)            02/12/2021                MCV                      87.6                02/12/2021                PLT                      255                 02/12/2021              Anesthesia Other Findings   Reproductive/Obstetrics (+) Pregnancy                             Anesthesia Physical Anesthesia Plan  ASA: III  Anesthesia Plan: Epidural   Post-op Pain Management:    Induction:   PONV Risk Score and Plan:   Airway Management Planned:   Additional Equipment:   Intra-op Plan:   Post-operative Plan:   Informed Consent: I have reviewed the patients History and Physical, chart, labs and discussed the procedure including the risks, benefits and alternatives for the proposed anesthesia with the patient or authorized representative who has indicated his/her understanding and acceptance.        Plan Discussed with:   Anesthesia Plan Comments: (  38 wk G6p5 w gHTN for LEA)        Anesthesia Quick Evaluation

## 2021-02-13 NOTE — Anesthesia Postprocedure Evaluation (Signed)
Anesthesia Post Note  Patient: Kristen Patterson  Procedure(s) Performed: AN AD HOC LABOR EPIDURAL     Patient location during evaluation: Mother Baby Anesthesia Type: Epidural Level of consciousness: awake and alert Pain management: pain level controlled Vital Signs Assessment: post-procedure vital signs reviewed and stable Respiratory status: spontaneous breathing, nonlabored ventilation and respiratory function stable Cardiovascular status: stable Postop Assessment: no headache, no backache and epidural receding Anesthetic complications: no   No complications documented.  Last Vitals:  Vitals:   02/13/21 0747 02/13/21 0802  BP: 140/81 (!) 140/91  Pulse: 77 74  Resp:    Temp:    SpO2:      Last Pain:  Vitals:   02/13/21 0630  TempSrc:   PainSc: 0-No pain                 Garritt Molyneux

## 2021-02-13 NOTE — H&P (Signed)
Kristen Patterson is a 25 y.o. female, G6 P5006, EGA [redacted] weeks with EDC 3-24 presenting for ctx.  On eval in [facility], slightly irreg ctx, VE 3 cm-no change from office.  However, all BP 140-150/90.  PNC complicated by anxiety controlled with Zoloft, slightly elevated BP in the office with normal labs 2 days ago.  OB History    Gravida  6   Para  5   Term  5   Preterm  0   AB  0   Living  6     SAB  0   IAB  0   Ectopic  0   Multiple  1   Live Births  6          Past Medical History:  Diagnosis Date  . Anemia    on Iron at home  . 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 SVD (spontaneous vaginal delivery) 08/30/2017  . SVD (spontaneous vaginal delivery) 12/07/2019   Past Surgical History:  Procedure Laterality Date  . ANTERIOR CRUCIATE LIGAMENT REPAIR    . CESAREAN SECTION MULTI-GESTATIONAL N/A 06/14/2015   Procedure: MULTI-GESTATIONAL VAGINAL DELIVERY, POSSIBLE CESAREAN SECTION;  Surgeon: Lavina Hamman, MD;  Location: [facility];  Service: Obstetrics;  Laterality: N/A;  . CYSTOSTOMY W/ BLADDER DILATION    . WISDOM TOOTH EXTRACTION     Family History: family history is not on file. Social History:  reports that she has never smoked. She has never used smokeless tobacco. She reports that she does not drink alcohol and does not use drugs.     Maternal Diabetes: No Genetic Screening: Declined Maternal Ultrasounds/Referrals: Normal Fetal Ultrasounds or other Referrals:  None Maternal Substance Abuse:  No Significant Maternal Medications:  Meds include: Zoloft Significant Maternal Lab Results:  Group B Strep positive Other Comments:  None  Review of Systems  Respiratory: Negative.   Cardiovascular: Negative.    Maternal Medical History:  Reason for admission: Contractions.   Contractions: Frequency: irregular.   Perceived severity is moderate.    Fetal activity: Perceived fetal activity is normal.    Prenatal complications: no prenatal complications Prenatal Complications -  Diabetes: none.   VE-4/50/-1, vtx Dilation: 3 Effacement (%): Thick Station: -3 Exam by:: N. Risheq, RN Blood pressure (!) 140/94, pulse 72, temperature 98 F (36.7 C), temperature source Oral, resp. rate 18, height 5\' 9"  (1.753 m), weight 119.5 kg, SpO2 96 %, unknown if currently breastfeeding. Maternal Exam:  Uterine Assessment: Contraction strength is moderate.  Contraction frequency is irregular.   Abdomen: Patient reports no abdominal tenderness. Estimated fetal weight is 8 lbs.   Fetal presentation: vertex  Introitus: Normal vulva. Normal vagina.  Amniotic fluid character: not assessed.  Pelvis: adequate for delivery.      Fetal Exam Fetal Monitor Review: Mode: ultrasound.   Baseline rate: 130.  Variability: moderate (6-25 bpm).   Pattern: accelerations present and no decelerations.    Fetal State Assessment: Category I - tracings are normal.     Physical Exam Vitals reviewed.  Constitutional:      Appearance: Normal appearance.  Cardiovascular:     Rate and Rhythm: Normal rate and regular rhythm.  Pulmonary:     Effort: Pulmonary effort is normal. No respiratory distress.  Abdominal:     Palpations: Abdomen is soft.  Genitourinary:    General: Normal vulva.  Neurological:     Mental Status: She is alert.     Prenatal labs: ABO, Rh: --/--/A POS (03/10 2232) Antibody: NEG (03/10  2232) Rubella: Immune (08/31 0000) RPR:   NR HBsAg: Negative (08/31 0000)  HIV: Non-reactive (08/31 0000)  GBS: Positive/-- (02/23 0000)   Assessment/Plan: IUP at 38 weeks, latent labor, PIH with normal labs, GBS pos.  Just received epidural, making some progress.  Will AROM closer to the 4 hour mark for 2nd dose PCN, anticipate SVD, monitor BP   [name] Inmer Nix 02/13/2021, 2:51 AM

## 2021-02-14 LAB — CBC
HCT: 28.7 % — ABNORMAL LOW (ref 36.0–46.0)
Hemoglobin: 9.9 g/dL — ABNORMAL LOW (ref 12.0–15.0)
MCH: 29.6 pg (ref 26.0–34.0)
MCHC: 34.5 g/dL (ref 30.0–36.0)
MCV: 85.7 fL (ref 80.0–100.0)
Platelets: 220 10*3/uL (ref 150–400)
RBC: 3.35 MIL/uL — ABNORMAL LOW (ref 3.87–5.11)
RDW: 13.5 % (ref 11.5–15.5)
WBC: 8.3 10*3/uL (ref 4.0–10.5)
nRBC: 0 % (ref 0.0–0.2)

## 2021-02-14 MED ORDER — IBUPROFEN 800 MG PO TABS: 800.0000 mg | ORAL_TABLET | Freq: Three times a day (TID) | ORAL | 1 refills | Status: DC | PRN

## 2021-02-14 NOTE — Progress Notes (Signed)
POSTPARTUM PROGRESS NOTE  Post Partum Day #1  Subjective:  No acute events overnight.  Pt denies problems with ambulating, voiding or po intake.  She denies nausea or vomiting.  Pain is well controlled. Lochia Minimal. Desires discharge today if baby is able to go home  Objective: Blood pressure 135/88, pulse 74, temperature 98.6 F (37 C), resp. rate 18, height 5\' 9"  (1.753 m), weight 119.5 kg, SpO2 98 %, unknown if currently breastfeeding.  Physical Exam:  General: alert, cooperative and no distress Lochia:normal flow Chest: CTAB Heart: RRR no m/r/g Abdomen: +BS, soft, nontender Uterine Fundus: firm, 2cm below umbilicus GU: suture intact, healing well, no purulent drainage Extremities: neg edema, neg calf TTP BL, neg Homans BL  Recent Labs    02/12/21 2232 02/14/21 0452  HGB 10.1* 9.9*  HCT 31.1* 28.7*    Assessment/Plan:  ASSESSMENT: Kristen Patterson is a 25 y.o. 34 s/p SVD @ [redacted]w[redacted]d. PNC c/b GHTN, grand multip, depression on Zoloft.   Discharge home, bottle feeding Elevated BP warranting admission - bring back in one week for BP check, normotensive since delivery and asymptomatic. PreE precautions given   LOS: 1 day

## 2021-02-14 NOTE — Discharge Summary (Signed)
Postpartum Discharge Summary  Date of Service updated     Patient Name: Kristen Patterson DOB: 1996/08/18 MRN: [id number]  Date of admission: 02/12/2021 Delivery date:02/13/2021  Delivering provider: Carlisle Cater  Date of discharge: 02/14/2021  Admitting diagnosis: PIH (pregnancy induced hypertension), third trimester [O13.3] Intrauterine pregnancy: [redacted]w[redacted]d     Secondary diagnosis:  Active Problems:   PIH (pregnancy induced hypertension), third trimester  Additional problems: Grand multip    Discharge diagnosis: Term Pregnancy Delivered and Gestational Hypertension                                              Post partum procedures:none Augmentation: AROM and Pitocin Complications: None  Hospital course: Induction of Labor With Vaginal Delivery   25 y.o. yo [id number] at [redacted]w[redacted]d was admitted to the hospital 02/12/2021 for induction of labor.  Indication for induction: Gestational hypertension.  Patient had an uncomplicated labor course as follows: Membrane Rupture Time/Date: 4:20 AM ,02/13/2021   Delivery Method:Vaginal, Spontaneous  Episiotomy: None  Lacerations:  1st degree  Details of delivery can be found in separate delivery note.  Patient had a routine postpartum course. Patient is discharged home 02/14/21.  Newborn Data: Birth date:02/13/2021  Birth time:7:35 AM  Gender:Female  Living status:Living  Apgars:8 ,9  Weight:3856 g    Physical exam  Vitals:   02/13/21 1505 02/13/21 1840 02/13/21 2306 02/14/21 0500  BP: 125/74 137/86 132/87 135/88  Pulse: 61 79 77 74  Resp: 16 16 18 18   Temp: 97.8 F (36.6 C) 97.7 F (36.5 C) 98.5 F (36.9 C) 98.6 F (37 C)  TempSrc: Oral Oral Oral   SpO2: 98% 100% 100% 98%  Weight:      Height:       General: alert, cooperative and no distress Lochia: appropriate Uterine Fundus: firm Incision: N/A DVT Evaluation: No evidence of DVT seen on physical exam. Negative Homan's sign. No cords or calf tenderness. Labs: Lab Results   Component Value Date   WBC 8.3 02/14/2021   HGB 9.9 (L) 02/14/2021   HCT 28.7 (L) 02/14/2021   MCV 85.7 02/14/2021   PLT 220 02/14/2021   CMP Latest Ref Rng & Units 02/12/2021  Glucose 70 - 99 mg/dL 04/14/2021)  BUN 6 - 20 mg/dL 7  Creatinine 101(B - 5.10 mg/dL 2.58  Sodium 5.27 - 782 mmol/L 134(L)  Potassium 3.5 - 5.1 mmol/L 3.8  Chloride 98 - 111 mmol/L 107  CO2 22 - 32 mmol/L 19(L)  Calcium 8.9 - 10.3 mg/dL 423)  Total Protein 6.5 - 8.1 g/dL 5.3(I)  Total Bilirubin 0.3 - 1.2 mg/dL 0.6  Alkaline Phos 38 - 126 U/L 172(H)  AST 15 - 41 U/L 17  ALT 0 - 44 U/L 9   Edinburgh Score: Edinburgh Postnatal Depression Scale Screening Tool 02/13/2021  I have been able to laugh and see the funny side of things. 0  I have looked forward with enjoyment to things. 0  I have blamed myself unnecessarily when things went wrong. 3  I have been anxious or worried for no good reason. 0  I have felt scared or panicky for no good reason. 0  Things have been getting on top of me. 1  I have been so unhappy that I have had difficulty sleeping. 0  I have felt sad or miserable. 1  I have been so  unhappy that I have been crying. 0  The thought of harming myself has occurred to me. 0  Edinburgh Postnatal Depression Scale Total 5      After visit meds:  Allergies as of 02/14/2021   No Known Allergies     Medication List    STOP taking these medications   hydrOXYzine 25 MG tablet Commonly known as: ATARAX/VISTARIL     TAKE these medications   acetaminophen 325 MG tablet Commonly known as: Tylenol Take 2 tablets (650 mg total) by mouth every 4 (four) hours as needed (for pain scale < 4).   ibuprofen 800 MG tablet Commonly known as: ADVIL Take 1 tablet (800 mg total) by mouth every 8 (eight) hours as needed. What changed:   medication strength  how much to take  when to take this  reasons to take this   prenatal multivitamin Tabs tablet Take 1 tablet by mouth daily at 12 noon.    sertraline 100 MG tablet Commonly known as: ZOLOFT Take 100 mg by mouth daily.        Discharge home in stable condition Infant Feeding: Bottle Infant Disposition:home with mother Discharge instruction: per After Visit Summary and Postpartum booklet. Activity: Advance as tolerated. Pelvic rest for 6 weeks.  Diet: routine diet Anticipated Birth Control: husband vasectomy Postpartum Appointment:6 weeks Additional Postpartum F/U: BP check 1 week     02/14/2021 Carlisle Cater, MD

## 2021-02-16 ENCOUNTER — Telehealth: Payer: Self-pay

## 2021-02-16 DIAGNOSIS — Z0000 Encounter for general adult medical examination without abnormal findings: Secondary | ICD-10-CM

## 2021-02-16 NOTE — Telephone Encounter (Signed)
Transition Care Management Unsuccessful Follow-up Telephone Call  Date of discharge and from where:  02/14/2021 from [facility]  Attempts:  1st Attempt  Reason for unsuccessful TCM follow-up call:  Unable to leave message

## 2021-02-17 NOTE — Telephone Encounter (Signed)
Transition Care Management Unsuccessful Follow-up Telephone Call  Date of discharge and from where:  02/14/2021 from [facility]  Attempts:  2nd Attempt  Reason for unsuccessful TCM follow-up call:  Voice mail full

## 2021-02-18 NOTE — Telephone Encounter (Signed)
Transition Care Management Unsuccessful Follow-up Telephone Call  Date of discharge and from where:  02/14/2021 - [facility]  Attempts:  3rd Attempt  Reason for unsuccessful TCM follow-up call:  Voice mail full   Referral placed for pt to be assigned a PCP, per our records she does not have one.

## 2021-02-19 ENCOUNTER — Other Ambulatory Visit: Payer: Self-pay

## 2021-02-19 NOTE — Patient Outreach (Signed)
  Medicaid Managed Care Social Work Note  02/19/2021 Name:  Anisah Kuck MRN:  [id number] DOB:  07-04-1996  Alfretta Grego is an 25 y.o. year old female who is a primary patient of Patient, No Pcp Per.  The [facility] Managed Care Coordination team was consulted for assistance with:  PCP  Ms. Waight was given information about Medicaid Managed CareCoordination services today. Alisen Cagley agreed to services and verbal consent obtained.  Engaged with patient  for by telephone forinitial visit in response to referral for case management and/or care coordination services.   Assessments/Interventions:  Review of past medical history, allergies, medications, health status, including review of consultants reports, laboratory and other test data, was performed as part of comprehensive evaluation and provision of chronic care management services.  SDOH: (Social Determinant of Health) assessments and interventions performed:   BSW contacted patient to get a PCP set up. Patient stated that she did not need any assistance getting set up with a PCP and she would do it herself. BSW asked if any resources were needed patient stated no.   Advanced Directives Status:  Not addressed in this encounter.  Care Plan                 No Known Allergies  Medications Reviewed Today    Reviewed by Nolon Bussing, CPhT (Pharmacy Technician) on 02/13/21 at 1018  Med List Status: Complete  Medication Order Taking? Sig Documenting Provider Last Dose Status Informant  acetaminophen (TYLENOL) 325 MG tablet [id number] Yes Take 2 tablets (650 mg total) by mouth every 4 (four) hours as needed (for pain scale < 4). Huel Cote, MD unk Active Self  hydrOXYzine (ATARAX/VISTARIL) 25 MG tablet [id number] No Take 1 tablet (25 mg total) by mouth every 6 (six) hours.  Patient not taking: Reported on 02/13/2021   Rennis Harding, PA-C Not Taking Unknown time Active Self  ibuprofen (ADVIL) 600 MG tablet [id number] No Take  1 tablet (600 mg total) by mouth every 6 (six) hours.  Patient not taking: Reported on 02/13/2021   Huel Cote, MD Not Taking Unknown time Active Self  Prenatal Vit-Fe Fumarate-FA (PRENATAL MULTIVITAMIN) TABS tablet [id number] Yes Take 1 tablet by mouth daily at 12 noon. [provider] 02/12/2021 Unknown time Active Self  sertraline (ZOLOFT) 100 MG tablet [id number] Yes Take 100 mg by mouth daily. [provider] 02/12/2021 Unknown time Active Self          Patient Active Problem List   Diagnosis Date Noted  . PIH (pregnancy induced hypertension), third trimester 02/13/2021  . SVD (spontaneous vaginal delivery) 12/07/2019  . Indication for care in labor or delivery 08/29/2017  . Normal labor 06/17/2016  . Twin pregnancy delivered vaginally 06/14/2015  . Twin pregnancy in third trimester 06/13/2015  . Determine fetal presentation using ultrasound   . [redacted] weeks gestation of pregnancy     Conditions to be addressed/monitored per PCP order:  PCP  There are no care plans that you recently modified to display for this patient.   Follow up:  Patient requests no follow-up at this time.  Plan: The patient has been provided with contact information for the Managed Medicaid care management team and has been advised to call with any health related questions or concerns.    Gus Puma, BSW, [facility]  [place]  High Risk Managed Medicaid Team

## 2021-02-19 NOTE — Patient Instructions (Signed)
Visit Information  Ms. Baines was given information about Medicaid Managed Care team care coordination services as a part of their [facility] Medicaid benefit. Marae Lindler did not consent to engagement with the [facility] Managed Care team.   For questions related to your [facility], please call: [phone] or visit the homepage here: [email or web address]  If you would like to schedule transportation through your [facility], please call the following number at least 2 days in advance of your appointment: [phone].   Ms. Waldman - following are the goals we discussed in your visit today:  Goals Addressed   None      The patient has been provided with contact information for the Managed Medicaid care management team and has been advised to call with any health related questions or concerns.   Gus Puma, BSW, [facility]  [place]  High Risk Managed Medicaid Team    Following is a copy of your plan of care:  There are no care plans to display for this patient.

## 2021-04-02 DIAGNOSIS — Z3009 Encounter for other general counseling and advice on contraception: Secondary | ICD-10-CM | POA: Diagnosis not present

## 2021-04-02 DIAGNOSIS — Z1389 Encounter for screening for other disorder: Secondary | ICD-10-CM | POA: Diagnosis not present

## 2021-04-02 DIAGNOSIS — Z30430 Encounter for insertion of intrauterine contraceptive device: Secondary | ICD-10-CM | POA: Diagnosis not present

## 2021-04-02 DIAGNOSIS — R030 Elevated blood-pressure reading, without diagnosis of hypertension: Secondary | ICD-10-CM | POA: Diagnosis not present

## 2021-04-30 DIAGNOSIS — Z30431 Encounter for routine checking of intrauterine contraceptive device: Secondary | ICD-10-CM | POA: Diagnosis not present

## 2021-05-08 ENCOUNTER — Ambulatory Visit: Payer: Medicaid Other | Admitting: Nurse Practitioner

## 2021-05-11 ENCOUNTER — Ambulatory Visit (INDEPENDENT_AMBULATORY_CARE_PROVIDER_SITE_OTHER): Payer: Medicaid Other | Admitting: Nurse Practitioner

## 2021-05-11 ENCOUNTER — Other Ambulatory Visit: Payer: Self-pay

## 2021-05-11 ENCOUNTER — Encounter: Payer: Self-pay | Admitting: Nurse Practitioner

## 2021-05-11 VITALS — BP 145/81 | HR 71 | Temp 98.0°F | Resp 18 | Ht 69.0 in | Wt 240.1 lb

## 2021-05-11 DIAGNOSIS — I10 Essential (primary) hypertension: Secondary | ICD-10-CM | POA: Diagnosis not present

## 2021-05-11 DIAGNOSIS — O99340 Other mental disorders complicating pregnancy, unspecified trimester: Secondary | ICD-10-CM | POA: Insufficient documentation

## 2021-05-11 DIAGNOSIS — Z0000 Encounter for general adult medical examination without abnormal findings: Secondary | ICD-10-CM

## 2021-05-11 DIAGNOSIS — Z139 Encounter for screening, unspecified: Secondary | ICD-10-CM | POA: Insufficient documentation

## 2021-05-11 DIAGNOSIS — Z7689 Persons encountering health services in other specified circumstances: Secondary | ICD-10-CM

## 2021-05-11 DIAGNOSIS — D509 Iron deficiency anemia, unspecified: Secondary | ICD-10-CM | POA: Diagnosis not present

## 2021-05-11 DIAGNOSIS — R519 Headache, unspecified: Secondary | ICD-10-CM

## 2021-05-11 MED ORDER — NAPROXEN 500 MG PO TABS
500.0000 mg | ORAL_TABLET | Freq: Two times a day (BID) | ORAL | 1 refills | Status: DC | PRN
Start: 2021-05-11 — End: 2023-04-04

## 2021-05-11 MED ORDER — HYDROCHLOROTHIAZIDE 12.5 MG PO TABS
12.5000 mg | ORAL_TABLET | Freq: Every day | ORAL | 3 refills | Status: DC
Start: 2021-05-11 — End: 2023-04-04

## 2021-05-11 NOTE — Assessment & Plan Note (Addendum)
-  was recently started on nifedipine by her GYN BP Readings from Last 3 Encounters:  05/11/21 (!) 145/81  02/14/21 135/88  12/08/19 129/80  -Rx. HCTZ -recheck in 1 month -she is not breastfeeding -recommended she get home BP cuff to determine correlation between BP and headache

## 2021-05-11 NOTE — Progress Notes (Signed)
New Patient Office Visit  Subjective:  Patient ID: Kristen Patterson, female    DOB: Feb 18, 1996  Age: 25 y.o. MRN: [id number]  CC:  Chief Complaint  Patient presents with  . New Patient (Initial Visit)    New patient has not seen primary in awhile bp has been running high     HPI Kristen Patterson presents for new patient visit. No recent PCP, physical, or labs.  She is followed by [facility] for OB/GYN needs ([facility]).  She states that her BP has been elevated lately, and her OB put her on BP medications. She does not check her BP at home.  She states that her GYN started her on nifedipine 30 mg daily.  She states that she has been getting headaches around lunch that last until she takes her BP medication again.  She has been taking tylenol for her headaches, but she states the tylenol wears of fairly quickly.  These are frontal headaches that she rates at 5/10.  She has mirena IUD in place.  She is not currently breast feeding.    Past Medical History:  Diagnosis Date  . [redacted] weeks gestation of pregnancy   . Anemia    iron deficiency  . Breech presentation 01/28/2021  . 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 PIH (pregnancy induced hypertension), third trimester 02/13/2021  . SVD (spontaneous vaginal delivery) 08/30/2017  . SVD (spontaneous vaginal delivery) 12/07/2019  . Twin pregnancy delivered vaginally 06/14/2015    Past Surgical History:  Procedure Laterality Date  . ANTERIOR CRUCIATE LIGAMENT REPAIR    . CESAREAN SECTION MULTI-GESTATIONAL N/A 06/14/2015   Procedure: MULTI-GESTATIONAL VAGINAL DELIVERY  Surgeon: Cheri Fowler, MD;  Location: [facility];  Service: Obstetrics;  Laterality: N/A;  . CYSTOSTOMY W/ BLADDER DILATION    . WISDOM TOOTH EXTRACTION      History reviewed. No pertinent family history.  Social History   Socioeconomic History  . Marital status: Married    Spouse name: Not on file  . Number of children: 7  . Years of education: Not on file  .  Highest education level: Not on file  Occupational History  . Occupation: Unemployed  Tobacco Use  . Smoking status: Never Smoker  . Smokeless tobacco: Never Used  Vaping Use  . Vaping Use: Never used  Substance and Sexual Activity  . Alcohol use: No  . Drug use: No  . Sexual activity: Yes    Birth control/protection: I.U.D.    Comment: Mirena  Other Topics Concern  . Not on file  Social History Narrative  . Not on file   Social Determinants of Health   Financial Resource Strain: Not on file  Food Insecurity: Not on file  Transportation Needs: Not on file  Physical Activity: Not on file  Stress: Not on file  Social Connections: Not on file  Intimate Partner Violence: Not on file    ROS Review of Systems  Constitutional: Negative.   Respiratory: Negative.   Cardiovascular: Negative.        BP elevated lately  Neurological: Positive for headaches.  Psychiatric/Behavioral: Negative.     Objective:   Today's Vitals: BP (!) 145/81 (BP Location: Right Arm, Patient Position: Sitting, Cuff Size: Normal)   Pulse 71   Temp 98 F (36.7 C) (Oral)   Resp 18   Ht '5\' 9"'  (1.753 m)   Wt 240 lb 1.9 oz (108.9 kg)   SpO2 96%   BMI 35.46 kg/m  Physical Exam Constitutional:      Appearance: Normal appearance. She is obese.  Cardiovascular:     Rate and Rhythm: Normal rate and regular rhythm.     Pulses: Normal pulses.     Heart sounds: Normal heart sounds.  Pulmonary:     Effort: Pulmonary effort is normal.     Breath sounds: Normal breath sounds.  Neurological:     Mental Status: She is alert.  Psychiatric:        Mood and Affect: Mood normal.        Behavior: Behavior normal.        Thought Content: Thought content normal.        Judgment: Judgment normal.     Assessment & Plan:   Problem List Items Addressed This Visit      Cardiovascular and Mediastinum   HTN (hypertension)    -was recently started on nifedipine by her GYN BP Readings from Last 3  Encounters:  05/11/21 (!) 145/81  02/14/21 135/88  12/08/19 129/80  -Rx. HCTZ -recheck in 1 month -she is not breastfeeding -recommended she get home BP cuff to determine correlation between BP and headache       Relevant Medications   NIFEdipine (PROCARDIA-XL/NIFEDICAL-XL) 30 MG 24 hr tablet   hydrochlorothiazide (HYDRODIURIL) 12.5 MG tablet     Other   Encounter to establish care   Relevant Orders   CBC with Differential/Platelet   CMP14+EGFR   Lipid Panel With LDL/HDL Ratio   Screening due    -will screen for HCV with next set of labs      Relevant Orders   Hepatitis C antibody   Headache    -Rx. Naproxen -no neuro effects, so not likely migraine -occurs at same time every day, near lunchtime, so may be caffeine or med related, but she denies taking NSAIDs lately -has 7 children; may be tension headache      Relevant Medications   NIFEdipine (PROCARDIA-XL/NIFEDICAL-XL) 30 MG 24 hr tablet   naproxen (NAPROSYN) 500 MG tablet   Iron deficiency anemia    -has hx of IDA -check iron panel with next set of labs      Relevant Orders   Iron, TIBC and Ferritin Panel    Other Visit Diagnoses    Annual physical exam    -  Primary      Outpatient Encounter Medications as of 05/11/2021  Medication Sig  . acetaminophen (TYLENOL) 325 MG tablet Take 2 tablets (650 mg total) by mouth every 4 (four) hours as needed (for pain scale < 4).  . hydrochlorothiazide (HYDRODIURIL) 12.5 MG tablet Take 1 tablet (12.5 mg total) by mouth daily.  Marland Kitchen levonorgestrel (MIRENA, 52 MG,) 20 MCG/DAY IUD Mirena 20 mcg/24 hours (7 yrs) 52 mg intrauterine device  Take 1 device by intrauterine route.  . naproxen (NAPROSYN) 500 MG tablet Take 1 tablet (500 mg total) by mouth 2 (two) times daily as needed for moderate pain or headache. Take with a meal  . NIFEdipine (PROCARDIA-XL/NIFEDICAL-XL) 30 MG 24 hr tablet Take 30 mg by mouth daily.  . sertraline (ZOLOFT) 100 MG tablet Take 100 mg by mouth daily.   . [DISCONTINUED] ibuprofen (ADVIL) 800 MG tablet Take 1 tablet (800 mg total) by mouth every 8 (eight) hours as needed. (Patient not taking: Reported on 05/11/2021)  . [DISCONTINUED] Prenatal Vit-Fe Fumarate-FA (PRENATAL MULTIVITAMIN) TABS tablet Take 1 tablet by mouth daily at 12 noon. (Patient not taking: Reported on 05/11/2021)   No facility-administered encounter medications  on file as of 05/11/2021.    Follow-up: Return in about 1 month (around 06/10/2021) for Physical Exam (no PAP).   Noreene Larsson, NP

## 2021-05-11 NOTE — Assessment & Plan Note (Signed)
-  has hx of IDA -check iron panel with next set of labs

## 2021-05-11 NOTE — Assessment & Plan Note (Signed)
-  Rx. Naproxen -no neuro effects, so not likely migraine -occurs at same time every day, near lunchtime, so may be caffeine or med related, but she denies taking NSAIDs lately -has 7 children; may be tension headache

## 2021-05-11 NOTE — Assessment & Plan Note (Signed)
-  will screen for HCV with next set of labs 

## 2021-05-11 NOTE — Patient Instructions (Signed)
Please have fasting labs drawn 2-3 days prior to your appointment so we can discuss the results during your office visit.  

## 2021-06-10 ENCOUNTER — Encounter: Payer: Medicaid Other | Admitting: Nurse Practitioner

## 2021-08-13 DIAGNOSIS — Z13220 Encounter for screening for lipoid disorders: Secondary | ICD-10-CM | POA: Diagnosis not present

## 2021-08-13 DIAGNOSIS — Z0000 Encounter for general adult medical examination without abnormal findings: Secondary | ICD-10-CM | POA: Diagnosis not present

## 2021-08-13 DIAGNOSIS — E559 Vitamin D deficiency, unspecified: Secondary | ICD-10-CM | POA: Diagnosis not present

## 2022-03-15 DIAGNOSIS — S7012XA Contusion of left thigh, initial encounter: Secondary | ICD-10-CM | POA: Diagnosis not present

## 2022-03-15 DIAGNOSIS — M79652 Pain in left thigh: Secondary | ICD-10-CM | POA: Diagnosis not present

## 2022-04-05 DIAGNOSIS — M79652 Pain in left thigh: Secondary | ICD-10-CM | POA: Diagnosis not present

## 2022-04-08 DIAGNOSIS — Z6837 Body mass index (BMI) 37.0-37.9, adult: Secondary | ICD-10-CM | POA: Diagnosis not present

## 2022-04-08 DIAGNOSIS — Z1151 Encounter for screening for human papillomavirus (HPV): Secondary | ICD-10-CM | POA: Diagnosis not present

## 2022-04-08 DIAGNOSIS — Z0142 Encounter for cervical smear to confirm findings of recent normal smear following initial abnormal smear: Secondary | ICD-10-CM | POA: Diagnosis not present

## 2022-04-08 DIAGNOSIS — Z124 Encounter for screening for malignant neoplasm of cervix: Secondary | ICD-10-CM | POA: Diagnosis not present

## 2022-04-08 DIAGNOSIS — Z130 Encounter for screening for diseases of the blood and blood-forming organs and certain disorders involving the immune mechanism: Secondary | ICD-10-CM | POA: Diagnosis not present

## 2022-04-08 DIAGNOSIS — Z01419 Encounter for gynecological examination (general) (routine) without abnormal findings: Secondary | ICD-10-CM | POA: Diagnosis not present

## 2022-04-08 DIAGNOSIS — Z30432 Encounter for removal of intrauterine contraceptive device: Secondary | ICD-10-CM | POA: Diagnosis not present

## 2022-04-08 DIAGNOSIS — Z1272 Encounter for screening for malignant neoplasm of vagina: Secondary | ICD-10-CM | POA: Diagnosis not present

## 2022-04-08 DIAGNOSIS — Z1389 Encounter for screening for other disorder: Secondary | ICD-10-CM | POA: Diagnosis not present

## 2022-07-08 DIAGNOSIS — Z3201 Encounter for pregnancy test, result positive: Secondary | ICD-10-CM | POA: Diagnosis not present

## 2022-07-14 DIAGNOSIS — O200 Threatened abortion: Secondary | ICD-10-CM | POA: Diagnosis not present

## 2022-07-14 DIAGNOSIS — O3680X1 Pregnancy with inconclusive fetal viability, fetus 1: Secondary | ICD-10-CM | POA: Diagnosis not present

## 2022-07-14 DIAGNOSIS — Z3A08 8 weeks gestation of pregnancy: Secondary | ICD-10-CM | POA: Diagnosis not present

## 2022-07-14 DIAGNOSIS — O021 Missed abortion: Secondary | ICD-10-CM | POA: Diagnosis not present

## 2022-10-05 DIAGNOSIS — Z23 Encounter for immunization: Secondary | ICD-10-CM | POA: Diagnosis not present

## 2022-10-05 DIAGNOSIS — Z3201 Encounter for pregnancy test, result positive: Secondary | ICD-10-CM | POA: Diagnosis not present

## 2022-10-18 DIAGNOSIS — O26891 Other specified pregnancy related conditions, first trimester: Secondary | ICD-10-CM | POA: Diagnosis not present

## 2022-10-18 DIAGNOSIS — Z3A08 8 weeks gestation of pregnancy: Secondary | ICD-10-CM | POA: Diagnosis not present

## 2022-10-19 DIAGNOSIS — Z113 Encounter for screening for infections with a predominantly sexual mode of transmission: Secondary | ICD-10-CM | POA: Diagnosis not present

## 2022-10-19 DIAGNOSIS — Z3A09 9 weeks gestation of pregnancy: Secondary | ICD-10-CM | POA: Diagnosis not present

## 2022-10-19 DIAGNOSIS — Z3401 Encounter for supervision of normal first pregnancy, first trimester: Secondary | ICD-10-CM | POA: Diagnosis not present

## 2022-10-19 DIAGNOSIS — Z369 Encounter for antenatal screening, unspecified: Secondary | ICD-10-CM | POA: Diagnosis not present

## 2022-10-19 DIAGNOSIS — O218 Other vomiting complicating pregnancy: Secondary | ICD-10-CM | POA: Diagnosis not present

## 2022-10-19 DIAGNOSIS — O0941 Supervision of pregnancy with grand multiparity, first trimester: Secondary | ICD-10-CM | POA: Diagnosis not present

## 2022-10-19 LAB — OB RESULTS CONSOLE HIV ANTIBODY (ROUTINE TESTING): HIV: NONREACTIVE

## 2022-10-19 LAB — OB RESULTS CONSOLE GC/CHLAMYDIA
Chlamydia: NEGATIVE
Neisseria Gonorrhea: NEGATIVE

## 2022-10-19 LAB — OB RESULTS CONSOLE VARICELLA ZOSTER ANTIBODY, IGG: Varicella: IMMUNE

## 2022-10-19 LAB — OB RESULTS CONSOLE RUBELLA ANTIBODY, IGM: Rubella: IMMUNE

## 2022-10-19 LAB — OB RESULTS CONSOLE HEPATITIS B SURFACE ANTIGEN: Hepatitis B Surface Ag: NEGATIVE

## 2022-10-19 LAB — HEPATITIS C ANTIBODY: HCV Ab: NEGATIVE

## 2022-12-06 NOTE — L&D Delivery Note (Signed)
Delivery Note She progressed to complete and pushed twice.  At 5:34 AM a viable female was delivered via Vaginal, Spontaneous (Presentation:   Occiput Anterior).  APGAR: 9, 9; weight pending.   Placenta status: Spontaneous, Intact.  Cord: 3 vessels with the following complications: nuchal x1 reduced.  Anesthesia: Epidural Episiotomy: None Lacerations:  None Suture Repair: None Est. Blood Loss (mL): 97   Mom to postpartum.  Baby to Couplet care / Skin to Skin.  She wants BTL, understands this is permanent.  Scheduled for 1200 for Dr. Claiborne Billings, advised she could be bumped for c-section or cancelled if BP elevated.  [name] Kristen Patterson 05/10/2023, 5:54 AM

## 2022-12-22 DIAGNOSIS — O0942 Supervision of pregnancy with grand multiparity, second trimester: Secondary | ICD-10-CM | POA: Diagnosis not present

## 2022-12-22 DIAGNOSIS — Z3A18 18 weeks gestation of pregnancy: Secondary | ICD-10-CM | POA: Diagnosis not present

## 2022-12-22 DIAGNOSIS — Z363 Encounter for antenatal screening for malformations: Secondary | ICD-10-CM | POA: Diagnosis not present

## 2022-12-22 DIAGNOSIS — O99342 Other mental disorders complicating pregnancy, second trimester: Secondary | ICD-10-CM | POA: Diagnosis not present

## 2022-12-22 DIAGNOSIS — O99212 Obesity complicating pregnancy, second trimester: Secondary | ICD-10-CM | POA: Diagnosis not present

## 2023-01-19 DIAGNOSIS — Z362 Encounter for other antenatal screening follow-up: Secondary | ICD-10-CM | POA: Diagnosis not present

## 2023-01-19 DIAGNOSIS — Z3A22 22 weeks gestation of pregnancy: Secondary | ICD-10-CM | POA: Diagnosis not present

## 2023-01-19 DIAGNOSIS — O99342 Other mental disorders complicating pregnancy, second trimester: Secondary | ICD-10-CM | POA: Diagnosis not present

## 2023-01-19 DIAGNOSIS — O99212 Obesity complicating pregnancy, second trimester: Secondary | ICD-10-CM | POA: Diagnosis not present

## 2023-01-19 DIAGNOSIS — O0942 Supervision of pregnancy with grand multiparity, second trimester: Secondary | ICD-10-CM | POA: Diagnosis not present

## 2023-02-23 DIAGNOSIS — Z3689 Encounter for other specified antenatal screening: Secondary | ICD-10-CM | POA: Diagnosis not present

## 2023-02-23 DIAGNOSIS — Z23 Encounter for immunization: Secondary | ICD-10-CM | POA: Diagnosis not present

## 2023-03-01 DIAGNOSIS — O99810 Abnormal glucose complicating pregnancy: Secondary | ICD-10-CM | POA: Diagnosis not present

## 2023-04-03 ENCOUNTER — Inpatient Hospital Stay (HOSPITAL_COMMUNITY)
Admission: AD | Admit: 2023-04-03 | Discharge: 2023-04-04 | Disposition: A | Discharge status: Home / Self Care | Payer: Medicaid Other | Attending: Obstetrics and Gynecology | Admitting: Obstetrics and Gynecology

## 2023-04-03 ENCOUNTER — Encounter (HOSPITAL_COMMUNITY): Payer: Self-pay

## 2023-04-03 DIAGNOSIS — R112 Nausea with vomiting, unspecified: Secondary | ICD-10-CM | POA: Diagnosis not present

## 2023-04-03 DIAGNOSIS — O219 Vomiting of pregnancy, unspecified: Secondary | ICD-10-CM | POA: Diagnosis present

## 2023-04-03 DIAGNOSIS — Z3A32 32 weeks gestation of pregnancy: Secondary | ICD-10-CM

## 2023-04-03 LAB — CBC WITH DIFFERENTIAL/PLATELET
Abs Immature Granulocytes: 0.03 10*3/uL (ref 0.00–0.07)
Basophils Absolute: 0 10*3/uL (ref 0.0–0.1)
Basophils Relative: 0 %
Eosinophils Absolute: 0 10*3/uL (ref 0.0–0.5)
Eosinophils Relative: 0 %
HCT: 35 % — ABNORMAL LOW (ref 36.0–46.0)
Hemoglobin: 11.9 g/dL — ABNORMAL LOW (ref 12.0–15.0)
Immature Granulocytes: 0 %
Lymphocytes Relative: 8 %
Lymphs Abs: 0.7 10*3/uL (ref 0.7–4.0)
MCH: 31.2 pg (ref 26.0–34.0)
MCHC: 34 g/dL (ref 30.0–36.0)
MCV: 91.9 fL (ref 80.0–100.0)
Monocytes Absolute: 0.7 10*3/uL (ref 0.1–1.0)
Monocytes Relative: 8 %
Neutro Abs: 7.4 10*3/uL (ref 1.7–7.7)
Neutrophils Relative %: 84 %
Platelets: 287 10*3/uL (ref 150–400)
RBC: 3.81 MIL/uL — ABNORMAL LOW (ref 3.87–5.11)
RDW: 12.4 % (ref 11.5–15.5)
WBC: 8.9 10*3/uL (ref 4.0–10.5)
nRBC: 0 % (ref 0.0–0.2)

## 2023-04-03 LAB — COMPREHENSIVE METABOLIC PANEL
ALT: 11 U/L (ref 0–44)
AST: 14 U/L — ABNORMAL LOW (ref 15–41)
Albumin: 2.5 g/dL — ABNORMAL LOW (ref 3.5–5.0)
Alkaline Phosphatase: 106 U/L (ref 38–126)
Anion gap: 12 (ref 5–15)
BUN: <5 mg/dL — ABNORMAL LOW (ref 6–20)
CO2: 18 mmol/L — ABNORMAL LOW (ref 22–32)
Calcium: 8.3 mg/dL — ABNORMAL LOW (ref 8.9–10.3)
Chloride: 105 mmol/L (ref 98–111)
Creatinine, Ser: 0.44 mg/dL (ref 0.44–1.00)
GFR, Estimated: >60 mL/min (ref >60)
Glucose, Bld: 93 mg/dL (ref 70–99)
Potassium: 3.3 mmol/L — ABNORMAL LOW (ref 3.5–5.1)
Sodium: 135 mmol/L (ref 135–145)
Total Bilirubin: 1 mg/dL (ref 0.3–1.2)
Total Protein: 6.1 g/dL — ABNORMAL LOW (ref 6.5–8.1)

## 2023-04-03 LAB — URINALYSIS, ROUTINE W REFLEX MICROSCOPIC
Glucose, UA: NEGATIVE mg/dL
Hgb urine dipstick: NEGATIVE
Ketones, ur: 80 mg/dL — AB
Leukocytes,Ua: NEGATIVE
Nitrite: NEGATIVE
Protein, ur: 100 mg/dL — AB
Specific Gravity, Urine: 1.021 (ref 1.005–1.030)
pH: 7 (ref 5.0–8.0)

## 2023-04-03 MED ORDER — LACTATED RINGERS IV SOLN
Freq: Once | INTRAVENOUS | Status: AC
  Filled 2023-04-03: qty 1000

## 2023-04-03 MED ORDER — FAMOTIDINE IN NACL 20-0.9 MG/50ML-% IV SOLN
20.0000 mg | Freq: Once | INTRAVENOUS | Status: AC
  Administered 2023-04-03: 20 mg via INTRAVENOUS
  Filled 2023-04-03: qty 50

## 2023-04-03 MED ORDER — LACTATED RINGERS IV BOLUS
1000.0000 mL | Freq: Once | INTRAVENOUS | Status: AC
  Administered 2023-04-03: 1000 mL via INTRAVENOUS

## 2023-04-03 MED ORDER — ACETAMINOPHEN 500 MG PO TABS
1000.0000 mg | ORAL_TABLET | Freq: Once | ORAL | Status: AC
  Administered 2023-04-03: 1000 mg via ORAL
  Filled 2023-04-03: qty 2

## 2023-04-03 MED ORDER — PROMETHAZINE HCL 25 MG/ML IJ SOLN
25.0000 mg | Freq: Once | INTRAVENOUS | Status: AC
  Administered 2023-04-03: 25 mg via INTRAVENOUS
  Filled 2023-04-03: qty 1

## 2023-04-03 NOTE — MAU Note (Signed)
Pt has not had emesis or diarrhea episodes since arrival to unit. Tylenol given per order for fever. LR/Phenergan infused per pump.Pt resting quietly with eyes closed. Denies complaints or concerns.

## 2023-04-03 NOTE — MAU Provider Note (Signed)
History     CSN: [id number]  Arrival date and time: 04/03/23 2034   Event Date/Time   First Provider Initiated Contact with Patient 04/03/23 2217      Chief Complaint  Patient presents with   Emesis   Nausea   Abdominal Pain   HPI Ms. Kristen Patterson is a 27 y.o. year old [id number] female at [redacted]w[redacted]d weeks gestation who presents to MAU reporting nausea, vomiting and indigestion that keeps her up at night since Monday 03/28/2023. She reports she has not been able to keep anything down. She was recently Rx'd Prilosec. When she took it today, she threw it right back up. She complains of lower abdominal cramping; rated 3/10. She denies VB or LOF. She reports (+) FM. She receives [facility]; next appt is 04/05/2023. Her spouse is present and contributing to the history taking.  OB History     Gravida  7   Para  6   Term  6   Preterm  0   AB  0   Living  7      SAB  0   IAB  0   Ectopic  0   Multiple  1   Live Births  7           Past Medical History:  Diagnosis Date   [redacted] weeks gestation of pregnancy    Anemia    iron deficiency   Breech presentation 01/28/2021   Chronic UTI    as child   PIH (pregnancy induced hypertension), third trimester 02/13/2021   SVD (spontaneous vaginal delivery) 08/30/2017   SVD (spontaneous vaginal delivery) 12/07/2019   Twin pregnancy delivered vaginally 06/14/2015    Past Surgical History:  Procedure Laterality Date   ANTERIOR CRUCIATE LIGAMENT REPAIR     CESAREAN SECTION MULTI-GESTATIONAL N/A 06/14/2015   Procedure: MULTI-GESTATIONAL VAGINAL DELIVERY  Surgeon: Lavina Hamman, MD;  Location: [facility];  Service: Obstetrics;  Laterality: N/A;   CYSTOSTOMY W/ BLADDER DILATION     WISDOM TOOTH EXTRACTION      History reviewed. No pertinent family history.  Social History   Tobacco Use   Smoking status: Never   Smokeless tobacco: Never  Vaping Use   Vaping Use: Never used  Substance Use Topics   Alcohol use: No    Drug use: No    Allergies: No Known Allergies  Medications Prior to Admission  Medication Sig Dispense Refill Last Dose   Prenatal Vit-Fe Fumarate-FA (PRENATAL PO) Take 1 tablet by mouth daily.   04/02/2023   sertraline (ZOLOFT) 100 MG tablet Take 50 mg by mouth daily.   04/02/2023   acetaminophen (TYLENOL) 325 MG tablet Take 2 tablets (650 mg total) by mouth every 4 (four) hours as needed (for pain scale < 4). 30 tablet 0    hydrochlorothiazide (HYDRODIURIL) 12.5 MG tablet Take 1 tablet (12.5 mg total) by mouth daily. 90 tablet 3    levonorgestrel (MIRENA, 52 MG,) 20 MCG/DAY IUD Mirena 20 mcg/24 hours (7 yrs) 52 mg intrauterine device  Take 1 device by intrauterine route.      naproxen (NAPROSYN) 500 MG tablet Take 1 tablet (500 mg total) by mouth 2 (two) times daily as needed for moderate pain or headache. Take with a meal 30 tablet 1    NIFEdipine (PROCARDIA-XL/NIFEDICAL-XL) 30 MG 24 hr tablet Take 30 mg by mouth daily.       Review of Systems  Constitutional: Negative.   HENT: Negative.    Eyes: Negative.  Respiratory: Negative.    Cardiovascular: Negative.   Gastrointestinal:  Positive for nausea and vomiting.  Endocrine: Negative.   Genitourinary: Negative.   Musculoskeletal: Negative.   Skin: Negative.   Allergic/Immunologic: Negative.   Neurological: Negative.   Hematological: Negative.   Psychiatric/Behavioral: Negative.     Physical Exam   Blood pressure (!) 105/44, pulse 100, temperature (!) 101.1 F (38.4 C), temperature source Axillary, resp. rate 20, height 5\' 9"  (1.753 m), weight 121.1 kg, SpO2 97 %, unknown if currently breastfeeding.  Physical Exam Vitals and nursing note reviewed.  Constitutional:      Appearance: Normal appearance. She is obese.  Cardiovascular:     Rate and Rhythm: Tachycardia present.  Pulmonary:     Effort: Pulmonary effort is normal.  Abdominal:     Palpations: Abdomen is soft.  Genitourinary:    Comments: Not  indicated Musculoskeletal:        General: Normal range of motion.  Skin:    General: Skin is warm and dry.     Coloration: Skin is pale.  Neurological:     Mental Status: She is alert and oriented to person, place, and time.  Psychiatric:        Mood and Affect: Mood normal.        Behavior: Behavior normal.        Thought Content: Thought content normal.        Judgment: Judgment normal.     REACTIVE NST - FHR: 160 bpm / moderate variability / accels present / decels absent / TOCO: UI noted  Reassessment @ 0022: While starting infusion of MVI liter RN reports that the patient sat up abruptly reporting that she "didn't feel well" and started coughing. Benadryl 25 mg IVP ordered stat. MVI infusion stopped immediately and 2nd liter of LR bolus started. Reassessment @ 0100: Still feeling weak, but not feeling like she did immediately after MVI bag was started. Reassessment @ 0300: Feeling a little better now and ready to go home. MAU Course  Procedures  MDM CCUA CBC w/Diff CMP IVFs: Phenergan 25 mg in LR 1000 ml @ 999 ml/hr  -- resolved nausea/vomiting MVI 1 ampule in LR 1000 ml @ 999 ml/hr--->infusion stopped immediately after infusion started due to reports of not feeling well, coughing and vomiting Pepcid 20 mg IVPB PO Challenge -- patient tolerated well  Results for orders placed or performed during the hospital encounter of 04/03/23 (from the past 24 hour(s))  Urinalysis, Routine w reflex microscopic -Urine, Clean Catch     Status: Abnormal   Collection Time: 04/03/23  9:25 PM  Result Value Ref Range   Color, Urine AMBER (A) YELLOW   APPearance HAZY (A) CLEAR   Specific Gravity, Urine 1.021 1.005 - 1.030   pH 7.0 5.0 - 8.0   Glucose, UA NEGATIVE NEGATIVE mg/dL   Hgb urine dipstick NEGATIVE NEGATIVE   Bilirubin Urine SMALL (A) NEGATIVE   Ketones, ur 80 (A) NEGATIVE mg/dL   Protein, ur 161 (A) NEGATIVE mg/dL   Nitrite NEGATIVE NEGATIVE   Leukocytes,Ua NEGATIVE NEGATIVE    RBC / HPF 0-5 0 - 5 RBC/hpf   WBC, UA 0-5 0 - 5 WBC/hpf   Bacteria, UA RARE (A) NONE SEEN   Squamous Epithelial / HPF 11-20 0 - 5 /HPF   Mucus PRESENT   CBC with Differential/Platelet     Status: Abnormal   Collection Time: 04/03/23 11:03 PM  Result Value Ref Range   WBC 8.9 4.0 - 10.5 K/uL  RBC 3.81 (L) 3.87 - 5.11 MIL/uL   Hemoglobin 11.9 (L) 12.0 - 15.0 g/dL   HCT 16.1 (L) 09.6 - 04.5 %   MCV 91.9 80.0 - 100.0 fL   MCH 31.2 26.0 - 34.0 pg   MCHC 34.0 30.0 - 36.0 g/dL   RDW 40.9 81.1 - 91.4 %   Platelets 287 150 - 400 K/uL   nRBC 0.0 0.0 - 0.2 %   Neutrophils Relative % 84 %   Neutro Abs 7.4 1.7 - 7.7 K/uL   Lymphocytes Relative 8 %   Lymphs Abs 0.7 0.7 - 4.0 K/uL   Monocytes Relative 8 %   Monocytes Absolute 0.7 0.1 - 1.0 K/uL   Eosinophils Relative 0 %   Eosinophils Absolute 0.0 0.0 - 0.5 K/uL   Basophils Relative 0 %   Basophils Absolute 0.0 0.0 - 0.1 K/uL   Immature Granulocytes 0 %   Abs Immature Granulocytes 0.03 0.00 - 0.07 K/uL  Comprehensive metabolic panel     Status: Abnormal   Collection Time: 04/03/23 11:03 PM  Result Value Ref Range   Sodium 135 135 - 145 mmol/L   Potassium 3.3 (L) 3.5 - 5.1 mmol/L   Chloride 105 98 - 111 mmol/L   CO2 18 (L) 22 - 32 mmol/L   Glucose, Bld 93 70 - 99 mg/dL   BUN <5 (L) 6 - 20 mg/dL   Creatinine, Ser 7.82 0.44 - 1.00 mg/dL   Calcium 8.3 (L) 8.9 - 10.3 mg/dL   Total Protein 6.1 (L) 6.5 - 8.1 g/dL   Albumin 2.5 (L) 3.5 - 5.0 g/dL   AST 14 (L) 15 - 41 U/L   ALT 11 0 - 44 U/L   Alkaline Phosphatase 106 38 - 126 U/L   Total Bilirubin 1.0 0.3 - 1.2 mg/dL   GFR, Estimated >95 >62 mL/min   Anion gap 12 5 - 15    Assessment and Plan  1. Nausea and vomiting, unspecified vomiting type - Information provided on [employer] Diet - B.R.A.T. -Advised to advance diet slowly; perhaps attempt a full diet on Tuesday morning   2. [redacted] weeks gestation of pregnancy   - Discharge patient - Keep scheduled appt with [facility] on 04/05/23 -  Patient verbalized an understanding of the plan of care and agrees.    Raelyn Mora, CNM 04/03/2023, 10:17 PM

## 2023-04-03 NOTE — MAU Note (Signed)
.  Kristen Patterson is a 27 y.o. at [redacted]w[redacted]d here in MAU reporting: past week felt nauseous, indigestion that keeps her up during the night. Was given medication but threw it up. Has continued to have N/V throughout the day - unable to keep anything down and is weak. Reports lower abdominal cramping on the way here. Denies VB, LOR, or DFM.   Onset of complaint: 1 week  Pain score: 3 Vitals:   04/03/23 2105  BP: 135/80  Pulse: (!) 111  Resp: 20  Temp: 99.8 F (37.7 C)  SpO2: 99%     FHT:165 Lab orders placed from triage:  UA

## 2023-04-04 DIAGNOSIS — R112 Nausea with vomiting, unspecified: Secondary | ICD-10-CM

## 2023-04-04 DIAGNOSIS — Z3A32 32 weeks gestation of pregnancy: Secondary | ICD-10-CM

## 2023-04-04 MED ORDER — LACTATED RINGERS IV BOLUS
1000.0000 mL | Freq: Once | INTRAVENOUS | Status: AC
  Administered 2023-04-04: 1000 mL via INTRAVENOUS

## 2023-04-04 MED ORDER — DIPHENHYDRAMINE HCL 50 MG/ML IJ SOLN
25.0000 mg | Freq: Once | INTRAMUSCULAR | Status: AC
  Administered 2023-04-04: 25 mg via INTRAVENOUS

## 2023-04-04 MED ORDER — DIPHENHYDRAMINE HCL 50 MG/ML IJ SOLN
INTRAMUSCULAR | Status: AC
  Filled 2023-04-04: qty 1

## 2023-04-04 NOTE — MAU Note (Addendum)
0016  Multivitamin bag began per [facility] pump at 27ml/hr. Within 4 minutes patient suddenly reported "feeling unwell" and sat up in bed-skin flushed-infused stopped immediately-provider and charge RN notified - IV Benadryl given per order. Pt denies chest pain or pressure. Denies SOB or difficulty breathing. VS per flowsheet. O2 sat.99% Pt vomited moderate amount of green emesis.

## 2023-04-29 DIAGNOSIS — Z3685 Encounter for antenatal screening for Streptococcus B: Secondary | ICD-10-CM | POA: Diagnosis not present

## 2023-04-29 LAB — OB RESULTS CONSOLE GBS: GBS: NEGATIVE

## 2023-05-09 ENCOUNTER — Encounter (HOSPITAL_COMMUNITY): Payer: Self-pay | Admitting: Obstetrics and Gynecology

## 2023-05-09 ENCOUNTER — Other Ambulatory Visit: Payer: Self-pay

## 2023-05-09 ENCOUNTER — Inpatient Hospital Stay (HOSPITAL_COMMUNITY)
Admission: AD | Admit: 2023-05-09 | Discharge: 2023-05-11 | DRG: 807 | Disposition: A | Discharge status: Home / Self Care | Payer: Medicaid Other | Attending: Obstetrics and Gynecology | Admitting: Obstetrics and Gynecology

## 2023-05-09 DIAGNOSIS — F419 Anxiety disorder, unspecified: Secondary | ICD-10-CM | POA: Diagnosis not present

## 2023-05-09 DIAGNOSIS — K429 Umbilical hernia without obstruction or gangrene: Secondary | ICD-10-CM | POA: Diagnosis present

## 2023-05-09 DIAGNOSIS — O99344 Other mental disorders complicating childbirth: Secondary | ICD-10-CM | POA: Diagnosis not present

## 2023-05-09 DIAGNOSIS — O133 Gestational [pregnancy-induced] hypertension without significant proteinuria, third trimester: Secondary | ICD-10-CM | POA: Diagnosis not present

## 2023-05-09 DIAGNOSIS — O139 Gestational [pregnancy-induced] hypertension without significant proteinuria, unspecified trimester: Secondary | ICD-10-CM

## 2023-05-09 DIAGNOSIS — O0940 Supervision of pregnancy with grand multiparity, unspecified trimester: Principal | ICD-10-CM

## 2023-05-09 DIAGNOSIS — O9962 Diseases of the digestive system complicating childbirth: Secondary | ICD-10-CM | POA: Diagnosis not present

## 2023-05-09 DIAGNOSIS — O134 Gestational [pregnancy-induced] hypertension without significant proteinuria, complicating childbirth: Principal | ICD-10-CM | POA: Diagnosis present

## 2023-05-09 DIAGNOSIS — O99214 Obesity complicating childbirth: Secondary | ICD-10-CM | POA: Diagnosis not present

## 2023-05-09 DIAGNOSIS — Z3A37 37 weeks gestation of pregnancy: Secondary | ICD-10-CM | POA: Diagnosis not present

## 2023-05-09 DIAGNOSIS — Z560 Unemployment, unspecified: Secondary | ICD-10-CM

## 2023-05-09 LAB — COMPREHENSIVE METABOLIC PANEL
ALT: 10 U/L (ref 0–44)
AST: 16 U/L (ref 15–41)
Albumin: 2.4 g/dL — ABNORMAL LOW (ref 3.5–5.0)
Alkaline Phosphatase: 137 U/L — ABNORMAL HIGH (ref 38–126)
Anion gap: 8 (ref 5–15)
BUN: 6 mg/dL (ref 6–20)
CO2: 19 mmol/L — ABNORMAL LOW (ref 22–32)
Calcium: 8.5 mg/dL — ABNORMAL LOW (ref 8.9–10.3)
Chloride: 107 mmol/L (ref 98–111)
Creatinine, Ser: 0.54 mg/dL (ref 0.44–1.00)
GFR, Estimated: >60 mL/min (ref >60)
Glucose, Bld: 115 mg/dL — ABNORMAL HIGH (ref 70–99)
Potassium: 3.5 mmol/L (ref 3.5–5.1)
Sodium: 134 mmol/L — ABNORMAL LOW (ref 135–145)
Total Bilirubin: 0.6 mg/dL (ref 0.3–1.2)
Total Protein: 5.7 g/dL — ABNORMAL LOW (ref 6.5–8.1)

## 2023-05-09 LAB — CBC
HCT: 31.6 % — ABNORMAL LOW (ref 36.0–46.0)
Hemoglobin: 10.7 g/dL — ABNORMAL LOW (ref 12.0–15.0)
MCH: 29.9 pg (ref 26.0–34.0)
MCHC: 33.9 g/dL (ref 30.0–36.0)
MCV: 88.3 fL (ref 80.0–100.0)
Platelets: 308 10*3/uL (ref 150–400)
RBC: 3.58 MIL/uL — ABNORMAL LOW (ref 3.87–5.11)
RDW: 12.3 % (ref 11.5–15.5)
WBC: 9.1 10*3/uL (ref 4.0–10.5)
nRBC: 0 % (ref 0.0–0.2)

## 2023-05-09 LAB — PROTEIN / CREATININE RATIO, URINE
Creatinine, Urine: 253 mg/dL
Protein Creatinine Ratio: 0.09 mg/mg{Cre} (ref 0.00–0.15)
Total Protein, Urine: 24 mg/dL

## 2023-05-09 LAB — TYPE AND SCREEN
ABO/RH(D): A POS
Antibody Screen: NEGATIVE

## 2023-05-09 MED ORDER — LIDOCAINE HCL (PF) 1 % IJ SOLN: 30.0000 mL | INTRAMUSCULAR | Status: DC | PRN

## 2023-05-09 MED ORDER — ONDANSETRON HCL 4 MG/2ML IJ SOLN
4.0000 mg | Freq: Four times a day (QID) | INTRAMUSCULAR | Status: DC | PRN
  Administered 2023-05-10: 4 mg via INTRAVENOUS
  Filled 2023-05-09: qty 2

## 2023-05-09 MED ORDER — LACTATED RINGERS IV SOLN: INTRAVENOUS | Status: DC

## 2023-05-09 MED ORDER — EPHEDRINE 5 MG/ML INJ: 10.0000 mg | INTRAVENOUS | Status: DC | PRN

## 2023-05-09 MED ORDER — OXYTOCIN BOLUS FROM INFUSION
333.0000 mL | Freq: Once | INTRAVENOUS | Status: AC
  Administered 2023-05-10: 333 mL via INTRAVENOUS

## 2023-05-09 MED ORDER — OXYCODONE-ACETAMINOPHEN 5-325 MG PO TABS: 1.0000 | ORAL_TABLET | ORAL | Status: DC | PRN

## 2023-05-09 MED ORDER — OXYCODONE-ACETAMINOPHEN 5-325 MG PO TABS: 2.0000 | ORAL_TABLET | ORAL | Status: DC | PRN

## 2023-05-09 MED ORDER — FENTANYL-BUPIVACAINE-NACL 0.5-0.125-0.9 MG/250ML-% EP SOLN
12.0000 mL/h | EPIDURAL | Status: DC | PRN
  Administered 2023-05-10: 12 mL/h via EPIDURAL
  Filled 2023-05-09: qty 250

## 2023-05-09 MED ORDER — LACTATED RINGERS IV SOLN: 500.0000 mL | Freq: Once | INTRAVENOUS | Status: DC

## 2023-05-09 MED ORDER — PHENYLEPHRINE 80 MCG/ML (10ML) SYRINGE FOR IV PUSH (FOR BLOOD PRESSURE SUPPORT): 80.0000 ug | PREFILLED_SYRINGE | INTRAVENOUS | Status: DC | PRN

## 2023-05-09 MED ORDER — TERBUTALINE SULFATE 1 MG/ML IJ SOLN: 0.2500 mg | Freq: Once | INTRAMUSCULAR | Status: DC | PRN

## 2023-05-09 MED ORDER — OXYTOCIN-SODIUM CHLORIDE 30-0.9 UT/500ML-% IV SOLN: 2.5000 [IU]/h | INTRAVENOUS | Status: DC

## 2023-05-09 MED ORDER — LACTATED RINGERS IV SOLN: 500.0000 mL | INTRAVENOUS | Status: DC | PRN

## 2023-05-09 MED ORDER — DIPHENHYDRAMINE HCL 50 MG/ML IJ SOLN: 12.5000 mg | INTRAMUSCULAR | Status: DC | PRN

## 2023-05-09 MED ORDER — ACETAMINOPHEN 325 MG PO TABS: 650.0000 mg | ORAL_TABLET | ORAL | Status: DC | PRN

## 2023-05-09 MED ORDER — OXYTOCIN-SODIUM CHLORIDE 30-0.9 UT/500ML-% IV SOLN
1.0000 m[IU]/min | INTRAVENOUS | Status: DC
  Administered 2023-05-09: 2 m[IU]/min via INTRAVENOUS
  Filled 2023-05-09: qty 500

## 2023-05-09 MED ORDER — SOD CITRATE-CITRIC ACID 500-334 MG/5ML PO SOLN: 30.0000 mL | ORAL | Status: DC | PRN

## 2023-05-09 NOTE — MAU Provider Note (Signed)
History    [id number]  Arrival date and time: 05/09/23 1621    Chief Complaint  Patient presents with   Contractions    HPI Kristen Patterson is a 27 y.o. at [redacted]w[redacted]d by LMP, who presents for frequent contractions and found to have an elevated blood pressure.   Patient presented with contractions that are gotten more frequent today.  Pain is about 3/10, however she was contracting every 4-6 minutes for the last 3 hours.  During her evaluation here, she was noted to have elevated blood pressures - initial blood pressure was 152/87,  Repeat was still elevated.   Review of outside prenatal records from [facility] (in media tab): No diagnosis of hypertension in this pregnancy.  Occasional blood pressure elevations noted in early pregnancy, however no consistent elevated blood pressures beyond 20 weeks.  Pregnancy significant for grade multiparity, obesity.  Vaginal bleeding: No LOF: No Fetal Movement: Yes Contractions: Yes    OB History     Gravida  7   Para  6   Term  6   Preterm  0   AB  0   Living  7      SAB  0   IAB  0   Ectopic  0   Multiple  1   Live Births  7           Past Medical History:  Diagnosis Date   [redacted] weeks gestation of pregnancy    Anemia    iron deficiency   Breech presentation 01/28/2021   Chronic UTI    as child   PIH (pregnancy induced hypertension), third trimester 02/13/2021   SVD (spontaneous vaginal delivery) 08/30/2017   SVD (spontaneous vaginal delivery) 12/07/2019   Twin pregnancy delivered vaginally 06/14/2015    Past Surgical History:  Procedure Laterality Date   ANTERIOR CRUCIATE LIGAMENT REPAIR     CESAREAN SECTION MULTI-GESTATIONAL N/A 06/14/2015   Procedure: MULTI-GESTATIONAL VAGINAL DELIVERY  Surgeon: Lavina Hamman, MD;  Location: [facility];  Service: Obstetrics;  Laterality: N/A;   CYSTOSTOMY W/ BLADDER DILATION     WISDOM TOOTH EXTRACTION      History reviewed. No pertinent family history.  Social History    Socioeconomic History   Marital status: Married    Spouse name: Not on file   Number of children: 7   Years of education: Not on file   Highest education level: Not on file  Occupational History   Occupation: Unemployed  Tobacco Use   Smoking status: Never   Smokeless tobacco: Never  Vaping Use   Vaping Use: Never used  Substance and Sexual Activity   Alcohol use: No   Drug use: No   Sexual activity: Not Currently    Birth control/protection: I.U.D.    Comment: Mirena  Other Topics Concern   Not on file  Social History Narrative   Not on file   Social Determinants of Health   Financial Resource Strain: Not on file  Food Insecurity: Not on file  Transportation Needs: Not on file  Physical Activity: Not on file  Stress: Not on file  Social Connections: Not on file  Intimate Partner Violence: Not on file    No Known Allergies  No current facility-administered medications on file prior to encounter.   Current Outpatient Medications on File Prior to Encounter  Medication Sig Dispense Refill   Prenatal Vit-Fe Fumarate-FA (PRENATAL PO) Take 1 tablet by mouth daily.     sertraline (ZOLOFT) 100 MG tablet Take 50  mg by mouth daily.     acetaminophen (TYLENOL) 325 MG tablet Take 2 tablets (650 mg total) by mouth every 4 (four) hours as needed (for pain scale < 4). 30 tablet 0    Review of Systems  Constitutional:  Negative for chills and fever.  Eyes:  Negative for blurred vision.  Cardiovascular:  Negative for leg swelling.  Gastrointestinal:  Negative for abdominal pain and vomiting.  Genitourinary:  Negative for dysuria, frequency and urgency.  Musculoskeletal:  Negative for myalgias.  Skin:  Negative for itching.  Neurological:  Negative for dizziness.   Pertinent positives and negative per HPI, all others reviewed and negative  Physical Exam   BP 129/87   Pulse 90   Temp 98.3 F (36.8 C) (Oral)   Resp 19   Ht 5\' 9"  (1.753 m)   Wt 123.4 kg   SpO2 97%    BMI 40.18 kg/m   Patient Vitals for the past 24 hrs:  BP Temp Temp src Pulse Resp SpO2 Height Weight  05/09/23 1935 -- -- -- -- -- 97 % -- --  05/09/23 1931 129/87 -- -- 90 -- -- -- --  05/09/23 1930 -- -- -- -- -- 97 % -- --  05/09/23 1925 -- -- -- -- -- 97 % -- --  05/09/23 1920 -- -- -- -- -- 99 % -- --  05/09/23 1916 (!) 148/89 -- -- 89 -- -- -- --  05/09/23 1915 -- -- -- -- -- 98 % -- --  05/09/23 1913 -- -- -- -- -- 97 % -- --  05/09/23 1901 133/85 -- -- 93 -- 98 % -- --  05/09/23 1846 131/86 -- -- 88 -- -- -- --  05/09/23 1831 (!) 139/96 -- -- 97 -- -- -- --  05/09/23 1816 (!) 146/99 -- -- 93 -- -- -- --  05/09/23 1801 (!) 140/93 -- -- 92 -- -- -- --  05/09/23 1746 135/89 -- -- 96 -- -- -- --  05/09/23 1731 133/85 -- -- 98 -- -- -- --  05/09/23 1723 133/85 -- -- 94 -- -- -- --  05/09/23 1719 (!) 142/90 -- -- (!) 103 -- -- -- --  05/09/23 1647 (!) 152/87 98.3 F (36.8 C) Oral 94 19 100 % -- --  05/09/23 1644 -- -- -- -- -- -- 5\' 9"  (1.753 m) 123.4 kg    Physical Exam Vitals reviewed.  Constitutional:      General: She is not in acute distress.    Appearance: She is well-developed. She is not toxic-appearing.  HENT:     Head: Normocephalic and atraumatic.     Mouth/Throat:     Mouth: Mucous membranes are moist.  Eyes:     Extraocular Movements: Extraocular movements intact.  Cardiovascular:     Rate and Rhythm: Normal rate.  Pulmonary:     Effort: Pulmonary effort is normal. No respiratory distress.  Abdominal:     Palpations: Abdomen is soft.  Skin:    General: Skin is warm and dry.  Neurological:     Mental Status: She is alert and oriented to person, place, and time.  Psychiatric:        Mood and Affect: Mood normal.        Behavior: Behavior normal.     Cervical Exam Dilation: 2.5 Effacement (%): 60 Cervical Position: Posterior Station: -3 Presentation: Vertex Exam by:: DrNduele  FHT Baseline: 140 bpm Variability: moderate Accelerations: >2 15  X 15 accels Decelerations: Absent Uterine  activity: mild contractions every 2-3 mins  Reactive NST  Labs Results for orders placed or performed during the hospital encounter of 05/09/23 (from the past 24 hour(s))  Protein / creatinine ratio, urine     Status: None   Collection Time: 05/09/23  5:23 PM  Result Value Ref Range   Creatinine, Urine 253 mg/dL   Total Protein, Urine 24 mg/dL   Protein Creatinine Ratio 0.09 0.00 - 0.15 mg/mg[Cre]  CBC     Status: Abnormal   Collection Time: 05/09/23  5:37 PM  Result Value Ref Range   WBC 9.1 4.0 - 10.5 K/uL   RBC 3.58 (L) 3.87 - 5.11 MIL/uL   Hemoglobin 10.7 (L) 12.0 - 15.0 g/dL   HCT 19.1 (L) 47.8 - 29.5 %   MCV 88.3 80.0 - 100.0 fL   MCH 29.9 26.0 - 34.0 pg   MCHC 33.9 30.0 - 36.0 g/dL   RDW 62.1 30.8 - 65.7 %   Platelets 308 150 - 400 K/uL   nRBC 0.0 0.0 - 0.2 %  Comprehensive metabolic panel     Status: Abnormal   Collection Time: 05/09/23  5:37 PM  Result Value Ref Range   Sodium 134 (L) 135 - 145 mmol/L   Potassium 3.5 3.5 - 5.1 mmol/L   Chloride 107 98 - 111 mmol/L   CO2 19 (L) 22 - 32 mmol/L   Glucose, Bld 115 (H) 70 - 99 mg/dL   BUN 6 6 - 20 mg/dL   Creatinine, Ser 8.46 0.44 - 1.00 mg/dL   Calcium 8.5 (L) 8.9 - 10.3 mg/dL   Total Protein 5.7 (L) 6.5 - 8.1 g/dL   Albumin 2.4 (L) 3.5 - 5.0 g/dL   AST 16 15 - 41 U/L   ALT 10 0 - 44 U/L   Alkaline Phosphatase 137 (H) 38 - 126 U/L   Total Bilirubin 0.6 0.3 - 1.2 mg/dL   GFR, Estimated >96 >29 mL/min   Anion gap 8 5 - 15    Imaging No results found.  MAU Course  Procedures Lab Orders         Urinalysis, Routine w reflex microscopic -Urine, Clean Catch         CBC         Comprehensive metabolic panel         Protein / creatinine ratio, urine    No orders of the defined types were placed in this encounter.  Imaging Orders  No imaging studies ordered today    MDM Moderate (Level 3-4)  Assessment and Plan  #Gestational hypertension affecting seventh  pregnancy  [redacted] weeks gestation of pregnancy   Meets criteria for gHTN and is 37 weeks so will be admitted to labor and delivery for IOL. Labs negative for pre-eclampsia, no severe features noted. No cervical change while in MAU Report given to Dr Jackelyn Knife who agrees to admit patient.  #FWB NST: Reactive   Dispo: admit to L&D, care turned over to private physician  Sheppard Evens MD MPH OB Fellow, Faculty Practice [facility] 05/09/2023

## 2023-05-09 NOTE — H&P (Signed)
Kristen Patterson is a 27 y.o. female, G8 P61, EGA 37+ weeks with EDC 6-18 presenting for ctx.  On eval in MAU, irregular ctx, VE unchanged.  However, mildly elevated BP 130-150/80-90 with normal labs, meets criteria for PIH.  PNC complicated by anxiety treated with zoloft, grand multiparity.  OB History     Gravida  7   Para  6   Term  6   Preterm  0   AB  0   Living  7      SAB  0   IAB  0   Ectopic  0   Multiple  1   Live Births  7          Past Medical History:  Diagnosis Date   [redacted] weeks gestation of pregnancy    Anemia    iron deficiency   Breech presentation 01/28/2021   Chronic UTI    as child   PIH (pregnancy induced hypertension), third trimester 02/13/2021   SVD (spontaneous vaginal delivery) 08/30/2017   SVD (spontaneous vaginal delivery) 12/07/2019   Twin pregnancy delivered vaginally 06/14/2015   Past Surgical History:  Procedure Laterality Date   ANTERIOR CRUCIATE LIGAMENT REPAIR     CESAREAN SECTION MULTI-GESTATIONAL N/A 06/14/2015   Procedure: MULTI-GESTATIONAL VAGINAL DELIVERY  Surgeon: Lavina Hamman, MD;  Location: [facility];  Service: Obstetrics;  Laterality: N/A;   CYSTOSTOMY W/ BLADDER DILATION     WISDOM TOOTH EXTRACTION     Family History: family history is not on file. Social History:  reports that she has never smoked. She has never used smokeless tobacco. She reports that she does not drink alcohol and does not use drugs.     Maternal Diabetes: No Genetic Screening: Declined Maternal Ultrasounds/Referrals: Normal Fetal Ultrasounds or other Referrals:  None Maternal Substance Abuse:  No Significant Maternal Medications:  Meds include: Zoloft Significant Maternal Lab Results:  Group B Strep negative Number of Prenatal Visits:greater than 3 verified prenatal visits Other Comments:  None  Review of Systems  Respiratory: Negative.    Cardiovascular: Negative.    Maternal Medical History:  Reason for admission: Contractions.    Contractions: Frequency: irregular.   Perceived severity is moderate.   Fetal activity: Perceived fetal activity is normal.   Prenatal Complications - Diabetes: none.   Dilation: 2.5 Effacement (%): 60 Station: -3 Exam by:: DrNduele Blood pressure (!) 151/87, pulse 86, temperature 98.3 F (36.8 C), temperature source Oral, resp. rate 19, height 5\' 9"  (1.753 m), weight 123.4 kg, SpO2 99 %, unknown if currently breastfeeding. Maternal Exam:  Uterine Assessment: Contraction strength is moderate.  Contraction frequency is irregular.  Abdomen: Patient reports no abdominal tenderness. Estimated fetal weight is 7.5 lbs.   Fetal presentation: vertex Introitus: Normal vulva. Normal vagina.  Amniotic fluid character: not assessed. Pelvis: adequate for delivery.     Fetal Exam Fetal Monitor Review: Mode: ultrasound.   Baseline rate: 130.  Variability: moderate (6-25 bpm).   Pattern: accelerations present and no decelerations.   Fetal State Assessment: Category I - tracings are normal.   Physical Exam Vitals reviewed.  Constitutional:      Appearance: Normal appearance.  Cardiovascular:     Rate and Rhythm: Normal rate and regular rhythm.  Pulmonary:     Effort: Pulmonary effort is normal. No respiratory distress.  Abdominal:     Palpations: Abdomen is soft.  Genitourinary:    General: Normal vulva.  Neurological:     Mental Status: She is alert.  Prenatal labs: ABO, Rh: --/--/A POS (06/03 1734) Antibody: NEG (06/03 1734) Rubella:  Immune RPR:   NR HBsAg:   neg HIV:  NR  GBS:   neg  Assessment/Plan: IUP at 37+ weeks with PIH and maybe in latent labor.  Labs normal.  Will admit, augment with pitocin, monitor BP and labor progress, anticipate SVD   [name] Abdelrahman Nair 05/09/2023, 10:48 PM

## 2023-05-09 NOTE — MAU Note (Signed)
Kristen Patterson is a 27 y.o. at [redacted]w[redacted]d here in MAU reporting: she's been having consistent ctxs for the past 3 hours.  Denies VB or LOF.  Endorses +FM. LMP: NA Onset of complaint: today Pain score: 3 Vitals:   05/09/23 1647  BP: (!) 152/87  Pulse: 94  Resp: 19  Temp: 98.3 F (36.8 C)  SpO2: 100%     FHT:154 bpm Lab orders placed from triage:   UA

## 2023-05-10 ENCOUNTER — Encounter (HOSPITAL_COMMUNITY)
Admission: AD | Disposition: A | Discharge status: Home / Self Care | Payer: Self-pay | Source: Home / Self Care | Attending: Obstetrics and Gynecology

## 2023-05-10 ENCOUNTER — Inpatient Hospital Stay (HOSPITAL_COMMUNITY): Payer: Medicaid Other | Admitting: Anesthesiology

## 2023-05-10 ENCOUNTER — Encounter (HOSPITAL_COMMUNITY): Payer: Self-pay | Admitting: Obstetrics and Gynecology

## 2023-05-10 DIAGNOSIS — O9902 Anemia complicating childbirth: Secondary | ICD-10-CM | POA: Diagnosis not present

## 2023-05-10 DIAGNOSIS — Z3A38 38 weeks gestation of pregnancy: Secondary | ICD-10-CM | POA: Diagnosis not present

## 2023-05-10 DIAGNOSIS — O139 Gestational [pregnancy-induced] hypertension without significant proteinuria, unspecified trimester: Secondary | ICD-10-CM | POA: Diagnosis not present

## 2023-05-10 DIAGNOSIS — O134 Gestational [pregnancy-induced] hypertension without significant proteinuria, complicating childbirth: Secondary | ICD-10-CM | POA: Diagnosis not present

## 2023-05-10 DIAGNOSIS — D649 Anemia, unspecified: Secondary | ICD-10-CM | POA: Diagnosis not present

## 2023-05-10 HISTORY — DX: Gestational (pregnancy-induced) hypertension without significant proteinuria, unspecified trimester: O13.9

## 2023-05-10 LAB — RPR: RPR Ser Ql: NONREACTIVE

## 2023-05-10 SURGERY — LIGATION, FALLOPIAN TUBE, POSTPARTUM
Anesthesia: Epidural | Laterality: Bilateral

## 2023-05-10 MED ORDER — COCONUT OIL OIL: 1.0000 | TOPICAL_OIL | Status: DC | PRN

## 2023-05-10 MED ORDER — ZOLPIDEM TARTRATE 5 MG PO TABS: 5.0000 mg | ORAL_TABLET | Freq: Every evening | ORAL | Status: DC | PRN

## 2023-05-10 MED ORDER — SIMETHICONE 80 MG PO CHEW: 80.0000 mg | CHEWABLE_TABLET | ORAL | Status: DC | PRN

## 2023-05-10 MED ORDER — BENZOCAINE-MENTHOL 20-0.5 % EX AERO: 1.0000 | INHALATION_SPRAY | CUTANEOUS | Status: DC | PRN

## 2023-05-10 MED ORDER — MEASLES, MUMPS & RUBELLA VAC IJ SOLR: 0.5000 mL | Freq: Once | INTRAMUSCULAR | Status: DC

## 2023-05-10 MED ORDER — DIPHENHYDRAMINE HCL 25 MG PO CAPS: 25.0000 mg | ORAL_CAPSULE | Freq: Four times a day (QID) | ORAL | Status: DC | PRN

## 2023-05-10 MED ORDER — LACTATED RINGERS IV SOLN: INTRAVENOUS | Status: DC

## 2023-05-10 MED ORDER — FAMOTIDINE 20 MG PO TABS
40.0000 mg | ORAL_TABLET | Freq: Once | ORAL | Status: AC
  Administered 2023-05-10: 40 mg via ORAL
  Filled 2023-05-10: qty 2

## 2023-05-10 MED ORDER — OXYCODONE HCL 5 MG PO TABS: 5.0000 mg | ORAL_TABLET | ORAL | Status: DC | PRN

## 2023-05-10 MED ORDER — WITCH HAZEL-GLYCERIN EX PADS: 1.0000 | MEDICATED_PAD | CUTANEOUS | Status: DC | PRN

## 2023-05-10 MED ORDER — PRENATAL MULTIVITAMIN CH
1.0000 | ORAL_TABLET | Freq: Every day | ORAL | Status: DC
  Administered 2023-05-10 – 2023-05-11 (×2): 1 via ORAL
  Filled 2023-05-10 (×2): qty 1

## 2023-05-10 MED ORDER — MAGNESIUM HYDROXIDE 400 MG/5ML PO SUSP: 30.0000 mL | ORAL | Status: DC | PRN

## 2023-05-10 MED ORDER — ONDANSETRON HCL 4 MG/2ML IJ SOLN: 4.0000 mg | INTRAMUSCULAR | Status: DC | PRN

## 2023-05-10 MED ORDER — METHYLERGONOVINE MALEATE 0.2 MG PO TABS: 0.2000 mg | ORAL_TABLET | ORAL | Status: DC | PRN

## 2023-05-10 MED ORDER — SERTRALINE HCL 50 MG PO TABS
50.0000 mg | ORAL_TABLET | Freq: Every day | ORAL | Status: DC
  Administered 2023-05-10 – 2023-05-11 (×2): 50 mg via ORAL
  Filled 2023-05-10 (×2): qty 1

## 2023-05-10 MED ORDER — OXYCODONE HCL 5 MG PO TABS: 10.0000 mg | ORAL_TABLET | ORAL | Status: DC | PRN

## 2023-05-10 MED ORDER — LIDOCAINE HCL (PF) 1 % IJ SOLN
INTRAMUSCULAR | Status: DC | PRN
  Administered 2023-05-10: 5 mL via EPIDURAL

## 2023-05-10 MED ORDER — IBUPROFEN 600 MG PO TABS
600.0000 mg | ORAL_TABLET | Freq: Four times a day (QID) | ORAL | Status: DC
  Administered 2023-05-10 – 2023-05-11 (×5): 600 mg via ORAL
  Filled 2023-05-10 (×5): qty 1

## 2023-05-10 MED ORDER — DIBUCAINE (PERIANAL) 1 % EX OINT: 1.0000 | TOPICAL_OINTMENT | CUTANEOUS | Status: DC | PRN

## 2023-05-10 MED ORDER — METHYLERGONOVINE MALEATE 0.2 MG/ML IJ SOLN: 0.2000 mg | INTRAMUSCULAR | Status: DC | PRN

## 2023-05-10 MED ORDER — TETANUS-DIPHTH-ACELL PERTUSSIS 5-2.5-18.5 LF-MCG/0.5 IM SUSY: 0.5000 mL | PREFILLED_SYRINGE | Freq: Once | INTRAMUSCULAR | Status: DC

## 2023-05-10 MED ORDER — LIDOCAINE-EPINEPHRINE (PF) 1.5 %-1:200000 IJ SOLN
INTRAMUSCULAR | Status: DC | PRN
  Administered 2023-05-10: 5 mL via EPIDURAL

## 2023-05-10 MED ORDER — SENNOSIDES-DOCUSATE SODIUM 8.6-50 MG PO TABS
2.0000 | ORAL_TABLET | Freq: Every day | ORAL | Status: DC
  Administered 2023-05-11: 2 via ORAL
  Filled 2023-05-10: qty 2

## 2023-05-10 MED ORDER — ONDANSETRON HCL 4 MG PO TABS: 4.0000 mg | ORAL_TABLET | ORAL | Status: DC | PRN

## 2023-05-10 MED ORDER — ACETAMINOPHEN 325 MG PO TABS: 650.0000 mg | ORAL_TABLET | ORAL | Status: DC | PRN

## 2023-05-10 MED ORDER — METOCLOPRAMIDE HCL 10 MG PO TABS
10.0000 mg | ORAL_TABLET | Freq: Once | ORAL | Status: AC
  Administered 2023-05-10: 10 mg via ORAL
  Filled 2023-05-10: qty 1

## 2023-05-10 NOTE — Anesthesia Postprocedure Evaluation (Signed)
Anesthesia Post Note  Patient: Kristen Patterson  Procedure(s) Performed: AN AD HOC LABOR EPIDURAL     Patient location during evaluation: Mother Baby Anesthesia Type: Epidural Level of consciousness: awake and alert and oriented Pain management: satisfactory to patient Vital Signs Assessment: post-procedure vital signs reviewed and stable Respiratory status: respiratory function stable Cardiovascular status: stable Postop Assessment: no headache, no backache, epidural receding, patient able to bend at knees, no signs of nausea or vomiting, adequate PO intake and able to ambulate Anesthetic complications: no   No notable events documented.  Last Vitals:  Vitals:   05/10/23 0908 05/10/23 1247  BP: 126/82 121/72  Pulse: 63 87  Resp: 18 20  Temp: 36.6 C 36.7 C  SpO2: 99% 98%    Last Pain:  Vitals:   05/10/23 1247  TempSrc: Oral  PainSc:    Pain Goal:                   Damarco Keysor

## 2023-05-10 NOTE — Anesthesia Preprocedure Evaluation (Signed)
Anesthesia Evaluation    Reviewed: Allergy & Precautions, H&P , Patient's Chart, lab work & pertinent test results, Unable to perform ROS - Chart review only  History of Anesthesia Complications Negative for: history of anesthetic complications  Airway        Dental   Pulmonary neg pulmonary ROS   Pulmonary exam normal        Cardiovascular hypertension, Normal cardiovascular exam     Neuro/Psych  Headaches PSYCHIATRIC DISORDERS         GI/Hepatic negative GI ROS, Neg liver ROS,,,  Endo/Other    Morbid obesity  Renal/GU negative Renal ROS  negative genitourinary   Musculoskeletal   Abdominal  (+) + obese  Peds  Hematology  (+) Blood dyscrasia, anemia   Anesthesia Other Findings   Reproductive/Obstetrics                              Anesthesia Physical Anesthesia Plan  ASA: 3  Anesthesia Plan:    Post-op Pain Management: Tylenol PO (pre-op)* and Toradol IV (intra-op)*   Induction:   PONV Risk Score and Plan: Ondansetron, Dexamethasone, Treatment may vary due to age or medical condition and Scopolamine patch - Pre-op  Airway Management Planned:   Additional Equipment:   Intra-op Plan:   Post-operative Plan:   Informed Consent: I have reviewed the patients History and Physical, chart, labs and discussed the procedure including the risks, benefits and alternatives for the proposed anesthesia with the patient or authorized representative who has indicated his/her understanding and acceptance.       Plan Discussed with:   Anesthesia Plan Comments:         Anesthesia Quick Evaluation

## 2023-05-10 NOTE — Progress Notes (Signed)
Comfortable with epidural Afeb, VSS, BP 129-151/71-92 FHT-130, Cat I, irreg ctx VE-4/50/-2, vtx, AROM clear Continue pitocin, monitor BP and progress, anticipate SVD

## 2023-05-10 NOTE — Anesthesia Procedure Notes (Signed)
Epidural Patient location during procedure: OB Start time: 05/10/2023 12:00 AM End time: 05/10/2023 12:10 AM  Staffing Anesthesiologist: Leonides Grills, MD Performed: anesthesiologist   Preanesthetic Checklist Completed: patient identified, IV checked, site marked, risks and benefits discussed, monitors and equipment checked, pre-op evaluation and timeout performed  Epidural Patient position: sitting Prep: DuraPrep Patient monitoring: heart rate, cardiac monitor, continuous pulse ox and blood pressure Approach: midline Location: L3-L4 Injection technique: LOR air  Needle:  Needle type: Tuohy  Needle gauge: 17 G Needle length: 9 cm Needle insertion depth: 6 cm Catheter type: closed end flexible Catheter size: 19 Gauge Catheter at skin depth: 12 cm Test dose: negative and 1.5% lidocaine with Epi 1:200 K  Assessment Events: blood not aspirated, no cerebrospinal fluid, injection not painful, no injection resistance and negative IV test  Additional Notes Informed consent obtained prior to proceeding including risk of failure, 1% risk of PDPH, risk of minor discomfort and bruising. Discussed alternatives to epidural analgesia and patient desires to proceed.  Timeout performed pre-procedure verifying patient name, procedure, and platelet count.  Patient tolerated procedure well. Reason for block:procedure for pain

## 2023-05-10 NOTE — Anesthesia Preprocedure Evaluation (Addendum)
Anesthesia Evaluation  Patient identified by MRN, date of birth, ID band Patient awake    Reviewed: Allergy & Precautions, H&P , NPO status , Patient's Chart, lab work & pertinent test results  History of Anesthesia Complications Negative for: history of anesthetic complications  Airway Mallampati: II  TM Distance: >3 FB Neck ROM: full    Dental no notable dental hx. (+) Teeth Intact   Pulmonary neg pulmonary ROS   Pulmonary exam normal breath sounds clear to auscultation       Cardiovascular hypertension (Gestational), Normal cardiovascular exam Rhythm:regular Rate:Normal     Neuro/Psych negative neurological ROS  negative psych ROS   GI/Hepatic negative GI ROS, Neg liver ROS,,,  Endo/Other    Morbid obesity  Renal/GU negative Renal ROS  negative genitourinary   Musculoskeletal   Abdominal  (+) + obese  Peds  Hematology  (+) Blood dyscrasia, anemia   Anesthesia Other Findings   Reproductive/Obstetrics (+) Pregnancy                             Anesthesia Physical Anesthesia Plan  ASA: 3  Anesthesia Plan: Epidural   Post-op Pain Management:    Induction:   PONV Risk Score and Plan:   Airway Management Planned:   Additional Equipment:   Intra-op Plan:   Post-operative Plan:   Informed Consent: I have reviewed the patients History and Physical, chart, labs and discussed the procedure including the risks, benefits and alternatives for the proposed anesthesia with the patient or authorized representative who has indicated his/her understanding and acceptance.       Plan Discussed with:   Anesthesia Plan Comments:        Anesthesia Quick Evaluation

## 2023-05-10 NOTE — Progress Notes (Signed)
Patient is eating, ambulating.  Pain control is good.  Appropriate lochia.    Vitals:   05/10/23 0716 05/10/23 0732 05/10/23 0805 05/10/23 0908  BP: 127/81 129/78 123/76 126/82  Pulse: 76 66 65 63  Resp: 18 18 20 18   Temp:   97.9 F (36.6 C) 97.8 F (36.6 C)  TempSrc:   Oral Oral  SpO2:   98% 99%  Weight:      Height:        Fundus firm Abd: soft, nontender, umbilical hernia present Ext: no calf tenderness  Lab Results  Component Value Date   WBC 9.1 05/09/2023   HGB 10.7 (L) 05/09/2023   HCT 31.6 (L) 05/09/2023   MCV 88.3 05/09/2023   PLT 308 05/09/2023    --/--/A POS (06/03 1734)  A/P Post partum day 0. Cancelled postpartum tubal in setting of umbilical hernia.  Pt will f/u outpatient to plan at later date. GHTN: BPs have been in normal range this morning, will continue to monitor.  PIH labs yesterday wnl.  Routine care.    Philip Aspen

## 2023-05-11 LAB — BIRTH TISSUE RECOVERY COLLECTION (PLACENTA DONATION)

## 2023-05-11 MED ORDER — IBUPROFEN 800 MG PO TABS: 800.0000 mg | ORAL_TABLET | Freq: Three times a day (TID) | ORAL | 1 refills | Status: DC | PRN

## 2023-05-11 NOTE — Progress Notes (Signed)
Patient is eating, ambulating.  Pain control is good.  Appropriate lochia.    Vitals:   05/10/23 1247 05/10/23 1724 05/10/23 2123 05/11/23 0554  BP: 121/72 134/84 137/84 134/83  Pulse: 87 72 70 75  Resp: 20 18 17 18   Temp: 98.1 F (36.7 C) 97.6 F (36.4 C) 98.1 F (36.7 C) 97.9 F (36.6 C)  TempSrc: Oral Oral Oral Oral  SpO2: 98% 98% 98% 99%  Weight:      Height:        Fundus firm Abd: soft, nontender, umbilical hernia present Ext: no calf tenderness  Lab Results  Component Value Date   WBC 9.1 05/09/2023   HGB 10.7 (L) 05/09/2023   HCT 31.6 (L) 05/09/2023   MCV 88.3 05/09/2023   PLT 308 05/09/2023    --/--/A POS (06/03 1734)  A/P Post partum day 1. GHTN: BPs have been in normal range since delivery. Plan for 1 wk BP check Contraception - internal tubal desired. In presence of umbilical hernia, will refer to Gen Surg and bring in for early postpartum in order to schedule Depression - continue home Zoloft 50mg  [facility] Earline Stiner M Jasiya Markie

## 2023-05-11 NOTE — Discharge Summary (Signed)
Postpartum Discharge Summary  Date of Service updated     Patient Name: Kristen Patterson DOB: Sep 15, 1996 MRN: [id number]  Date of admission: 05/09/2023 Delivery date:05/10/2023  Delivering provider: Jackelyn Knife, TODD  Date of discharge: 05/11/2023  Admitting diagnosis: Gestational hypertension [O13.9] PIH (pregnancy induced hypertension), third trimester [O13.3] Intrauterine pregnancy: [redacted]w[redacted]d     Secondary diagnosis:  Principal Problem:   Gestational hypertension Active Problems:   PIH (pregnancy induced hypertension), third trimester  Additional problems: Grand multiparity    Discharge diagnosis: Term Pregnancy Delivered and Gestational Hypertension                                              Post partum procedures: none Augmentation: Induction of Labor With Vaginal Delivery  (AROM, pitocin)  27 y.o. yo [id number] at [redacted]w[redacted]d was admitted to the hospital 05/09/2023 for induction of labor.  Indication for induction: Gestational hypertension.  Patient had an labor course complicated by none Membrane Rupture Time/Date: 12:49 AM ,05/10/2023   Delivery Method:Vaginal, Spontaneous  Episiotomy: None  Lacerations:  None  Details of delivery can be found in separate delivery note.  Patient had a postpartum course complicated by umbilical hernia precluding postpartum BTL. Patient is discharged home 05/11/23.  Newborn Data: Birth date:05/10/2023  Birth time:5:34 AM  Gender:Female  Living status:Living  Apgars:9 ,9  Weight:3459 g  Complications: None  Magnesium Sulfate received: No  Physical exam  Vitals:   05/10/23 1247 05/10/23 1724 05/10/23 2123 05/11/23 0554  BP: 121/72 134/84 137/84 134/83  Pulse: 87 72 70 75  Resp: 20 18 17 18   Temp: 98.1 F (36.7 C) 97.6 F (36.4 C) 98.1 F (36.7 C) 97.9 F (36.6 C)  TempSrc: Oral Oral Oral Oral  SpO2: 98% 98% 98% 99%  Weight:      Height:       General: alert, cooperative, and no distress Lochia: appropriate Uterine Fundus: firm Incision:  N/A DVT Evaluation: No evidence of DVT seen on physical exam. Negative Homan's sign. No cords or calf tenderness. Labs: Lab Results  Component Value Date   WBC 9.1 05/09/2023   HGB 10.7 (L) 05/09/2023   HCT 31.6 (L) 05/09/2023   MCV 88.3 05/09/2023   PLT 308 05/09/2023      Latest Ref Rng & Units 05/09/2023    5:37 PM  CMP  Glucose 70 - 99 mg/dL 696   BUN 6 - 20 mg/dL 6   Creatinine 2.95 - 2.84 mg/dL 1.32   Sodium 440 - 102 mmol/L 134   Potassium 3.5 - 5.1 mmol/L 3.5   Chloride 98 - 111 mmol/L 107   CO2 22 - 32 mmol/L 19   Calcium 8.9 - 10.3 mg/dL 8.5   Total Protein 6.5 - 8.1 g/dL 5.7   Total Bilirubin 0.3 - 1.2 mg/dL 0.6   Alkaline Phos 38 - 126 U/L 137   AST 15 - 41 U/L 16   ALT 0 - 44 U/L 10    Edinburgh Score:    05/10/2023    9:08 AM  Edinburgh Postnatal Depression Scale Screening Tool  I have been able to laugh and see the funny side of things. 0  I have looked forward with enjoyment to things. 0  I have blamed myself unnecessarily when things went wrong. 0  I have been anxious or worried for no good reason. 0  I have  felt scared or panicky for no good reason. 0  Things have been getting on top of me. 0  I have been so unhappy that I have had difficulty sleeping. 0  I have felt sad or miserable. 0  I have been so unhappy that I have been crying. 0  The thought of harming myself has occurred to me. 0  Edinburgh Postnatal Depression Scale Total 0      After visit meds:  Allergies as of 05/11/2023   No Known Allergies      Medication List     TAKE these medications    acetaminophen 325 MG tablet Commonly known as: Tylenol Take 2 tablets (650 mg total) by mouth every 4 (four) hours as needed (for pain scale < 4).   ibuprofen 800 MG tablet Commonly known as: ADVIL Take 1 tablet (800 mg total) by mouth every 8 (eight) hours as needed.   PRENATAL PO Take 1 tablet by mouth daily.   sertraline 100 MG tablet Commonly known as: ZOLOFT Take 50 mg by  mouth daily.         Discharge home in stable condition Infant Feeding: Breast Infant Disposition:home with mother Discharge instruction: per After Visit Summary and Postpartum booklet. Activity: Advance as tolerated. Pelvic rest for 6 weeks.  Diet: routine diet Anticipated Birth Control: Plans Interval BTL Postpartum Appointment:6 weeks Additional Postpartum F/U: BP check 1 week Follow up Visit:[facility]   05/11/2023 Carlisle Cater, MD

## 2023-05-11 NOTE — Social Work (Signed)
MOB was referred for history of depression/anxiety.  * Referral screened out by Clinical Social Worker because none of the following criteria appear to apply:  ~ History of anxiety/depression during this pregnancy, or of post-partum depression following prior delivery.  ~ Diagnosis of anxiety and/or depression within last 3 years OR * MOB's symptoms currently being treated with medication and/or therapy. Per chart review MOB is currently prescribed Zoloft 100mg.  Please contact the Clinical Social Worker if needs arise, or by MOB request.   Sajan Cheatwood, LCSWA Clinical Social Worker [phone] 

## 2023-05-28 ENCOUNTER — Telehealth (HOSPITAL_COMMUNITY): Payer: Self-pay | Admitting: *Deleted

## 2023-05-28 NOTE — Telephone Encounter (Signed)
Attempted hospital discharge follow-up call. Left message for patient to return RN call with any questions or concerns. Deforest Hoyles, RN, 05/28/23, [phone]

## 2023-06-15 DIAGNOSIS — Z3009 Encounter for other general counseling and advice on contraception: Secondary | ICD-10-CM | POA: Diagnosis not present

## 2023-06-15 DIAGNOSIS — Z1331 Encounter for screening for depression: Secondary | ICD-10-CM | POA: Diagnosis not present

## 2023-07-08 ENCOUNTER — Encounter (HOSPITAL_BASED_OUTPATIENT_CLINIC_OR_DEPARTMENT_OTHER): Payer: Self-pay | Admitting: Obstetrics and Gynecology

## 2023-07-08 ENCOUNTER — Other Ambulatory Visit: Payer: Self-pay

## 2023-07-08 NOTE — Progress Notes (Signed)
Spoke w/ via phone for pre-op interview---pt Lab needs dos---- urine preg   , req surgery orders dr Jackelyn Knife epic ib           Lab results------none COVID test -----patient states asymptomatic no test needed Arrive at -------530 am 07-19-2023 NPO after MN NO Solid Food.  Clear liquids from MN until---430 Med rec completed Medications to take morning of surgery -----sertraline Diabetic medication -----n/a Patient instructed no nail polish to be worn day of surgery Patient instructed to bring photo id and insurance card day of surgery Patient aware to have Driver (ride ) / caregiver  husband kevin   for 24 hours after surgery  Patient Special Instructions -----none Pre-Op special Instructions -----none Patient verbalized understanding of instructions that were given at this phone interview. Patient denies shortness of breath, chest pain, fever, cough at this phone interview.

## 2023-07-18 ENCOUNTER — Encounter (HOSPITAL_BASED_OUTPATIENT_CLINIC_OR_DEPARTMENT_OTHER): Payer: Self-pay | Admitting: Obstetrics and Gynecology

## 2023-07-18 NOTE — H&P (Signed)
Kristen Patterson is an 27 y.o. female. She is s/p SVD on 05-10-2023, desires permanent sterility  Pertinent Gynecological History: Last pap: normal Date: 04/2022 OB History: G8, P7018 SVD x 7, one set of twins   Menstrual History: No LMP recorded.    Past Medical History:  Diagnosis Date   Anxiety    Breech presentation 01/28/2021   Chronic UTI    as child   PIH (pregnancy induced hypertension) 05/10/2023   SVD (spontaneous vaginal delivery) 08/30/2017   SVD (spontaneous vaginal delivery) 12/07/2019   Twin pregnancy delivered vaginally 06/14/2015    Past Surgical History:  Procedure Laterality Date   ANTERIOR CRUCIATE LIGAMENT REPAIR     yrs ago   CESAREAN SECTION MULTI-GESTATIONAL N/A 06/14/2015   Procedure: MULTI-GESTATIONAL VAGINAL DELIVERY  Surgeon: Kristen Hamman, MD;  Location: [facility];  Service: Obstetrics;  Laterality: N/A;   CYSTOSTOMY W/ BLADDER DILATION     WISDOM TOOTH EXTRACTION    She has NOT had a c-section, but I am unable to remove it from her history  History reviewed. No pertinent family history.  Social History:  reports that she has never smoked. She has never used smokeless tobacco. She reports that she does not drink alcohol and does not use drugs.  Allergies: No Known Allergies  No medications prior to admission.    Review of Systems  Respiratory: Negative.    Cardiovascular: Negative.     Height 5\' 9"  (1.753 m), weight 113.4 kg, unknown if currently breastfeeding. Physical Exam Constitutional:      Appearance: Normal appearance.  Cardiovascular:     Rate and Rhythm: Normal rate and regular rhythm.     Heart sounds: Normal heart sounds. No murmur heard. Pulmonary:     Effort: Pulmonary effort is normal. No respiratory distress.     Breath sounds: Normal breath sounds. No wheezing.  Abdominal:     General: There is no distension.     Palpations: Abdomen is soft. There is no mass.     Tenderness: There is no abdominal tenderness.   Genitourinary:    General: Normal vulva.     Comments: Uterus normal, no adnexal mass Musculoskeletal:     Cervical back: Normal range of motion and neck supple.  Neurological:     Mental Status: She is alert.     No results found for this or any previous visit (from the past 24 hour(s)).  No results found.  Assessment/Plan: Desires permanent sterility.  All options discussed, surgical procedure, risks, alternatives, permanency, failure rate all discussed, questions answered.  Will admit for Medicaid laparoscopic BTL  Kristen Patterson 07/18/2023, 9:20 PM

## 2023-07-18 NOTE — Anesthesia Preprocedure Evaluation (Signed)
Anesthesia Evaluation  Patient identified by MRN, date of birth, ID band Patient awake    Reviewed: Allergy & Precautions, NPO status , Patient's Chart, lab work & pertinent test results  History of Anesthesia Complications Negative for: history of anesthetic complications  Airway Mallampati: III  TM Distance: >3 FB Neck ROM: Full    Dental  (+) Dental Advisory Given, Teeth Intact   Pulmonary neg pulmonary ROS   Pulmonary exam normal        Cardiovascular negative cardio ROS Normal cardiovascular exam     Neuro/Psych  Headaches PSYCHIATRIC DISORDERS Anxiety        GI/Hepatic negative GI ROS, Neg liver ROS,,,  Endo/Other   Obesity   Renal/GU negative Renal ROS     Musculoskeletal negative musculoskeletal ROS (+)    Abdominal   Peds  Hematology negative hematology ROS (+)   Anesthesia Other Findings   Reproductive/Obstetrics  Hx PIH                              Anesthesia Physical Anesthesia Plan  ASA: 2  Anesthesia Plan: General   Post-op Pain Management: Tylenol PO (pre-op)* and Celebrex PO (pre-op)*   Induction: Intravenous  PONV Risk Score and Plan: 3 and Treatment may vary due to age or medical condition, Ondansetron, Dexamethasone and Midazolam  Airway Management Planned: Oral ETT  Additional Equipment: None  Intra-op Plan:   Post-operative Plan: Extubation in OR  Informed Consent: I have reviewed the patients History and Physical, chart, labs and discussed the procedure including the risks, benefits and alternatives for the proposed anesthesia with the patient or authorized representative who has indicated his/her understanding and acceptance.     Dental advisory given  Plan Discussed with: CRNA and Anesthesiologist  Anesthesia Plan Comments:        Anesthesia Quick Evaluation

## 2023-07-19 ENCOUNTER — Other Ambulatory Visit: Payer: Self-pay

## 2023-07-19 ENCOUNTER — Encounter (HOSPITAL_BASED_OUTPATIENT_CLINIC_OR_DEPARTMENT_OTHER)
Admission: RE | Disposition: A | Discharge status: Home / Self Care | Payer: Self-pay | Source: Home / Self Care | Attending: Obstetrics and Gynecology

## 2023-07-19 ENCOUNTER — Ambulatory Visit (HOSPITAL_BASED_OUTPATIENT_CLINIC_OR_DEPARTMENT_OTHER): Payer: Medicaid Other | Admitting: Anesthesiology

## 2023-07-19 ENCOUNTER — Ambulatory Visit (HOSPITAL_BASED_OUTPATIENT_CLINIC_OR_DEPARTMENT_OTHER)
Admission: RE | Admit: 2023-07-19 | Discharge: 2023-07-19 | Disposition: A | Discharge status: Home / Self Care | Payer: Medicaid Other | Source: Home / Self Care | Attending: Obstetrics and Gynecology | Admitting: Obstetrics and Gynecology

## 2023-07-19 ENCOUNTER — Encounter (HOSPITAL_BASED_OUTPATIENT_CLINIC_OR_DEPARTMENT_OTHER): Payer: Self-pay | Admitting: Obstetrics and Gynecology

## 2023-07-19 DIAGNOSIS — Z6836 Body mass index (BMI) 36.0-36.9, adult: Secondary | ICD-10-CM | POA: Diagnosis not present

## 2023-07-19 DIAGNOSIS — Z302 Encounter for sterilization: Secondary | ICD-10-CM | POA: Diagnosis not present

## 2023-07-19 DIAGNOSIS — E669 Obesity, unspecified: Secondary | ICD-10-CM | POA: Insufficient documentation

## 2023-07-19 DIAGNOSIS — Z01818 Encounter for other preprocedural examination: Secondary | ICD-10-CM

## 2023-07-19 HISTORY — PX: LAPAROSCOPIC BILATERAL SALPINGECTOMY: SHX5889

## 2023-07-19 HISTORY — DX: Anxiety disorder, unspecified: F41.9

## 2023-07-19 LAB — CBC
HCT: 37.5 % (ref 36.0–46.0)
Hemoglobin: 12.5 g/dL (ref 12.0–15.0)
MCH: 30.7 pg (ref 26.0–34.0)
MCHC: 33.3 g/dL (ref 30.0–36.0)
MCV: 92.1 fL (ref 80.0–100.0)
Platelets: 293 10*3/uL (ref 150–400)
RBC: 4.07 MIL/uL (ref 3.87–5.11)
RDW: 13.7 % (ref 11.5–15.5)
WBC: 5.3 10*3/uL (ref 4.0–10.5)
nRBC: 0 % (ref 0.0–0.2)

## 2023-07-19 LAB — POCT PREGNANCY, URINE: Preg Test, Ur: NEGATIVE

## 2023-07-19 SURGERY — SALPINGECTOMY, BILATERAL, LAPAROSCOPIC
Anesthesia: General | Site: Abdomen | Laterality: Bilateral

## 2023-07-19 MED ORDER — PROPOFOL 10 MG/ML IV BOLUS
INTRAVENOUS | Status: DC | PRN
Start: 2023-07-19 — End: 2023-07-19
  Administered 2023-07-19: 200 mg via INTRAVENOUS

## 2023-07-19 MED ORDER — FENTANYL CITRATE (PF) 100 MCG/2ML IJ SOLN
INTRAMUSCULAR | Status: DC | PRN
  Administered 2023-07-19 (×2): 50 ug via INTRAVENOUS

## 2023-07-19 MED ORDER — DEXMEDETOMIDINE HCL IN NACL 80 MCG/20ML IV SOLN
INTRAVENOUS | Status: DC | PRN
  Administered 2023-07-19: 4 ug via INTRAVENOUS
  Administered 2023-07-19: 8 ug via INTRAVENOUS

## 2023-07-19 MED ORDER — ROCURONIUM BROMIDE 10 MG/ML (PF) SYRINGE
PREFILLED_SYRINGE | INTRAVENOUS | Status: DC | PRN
  Administered 2023-07-19: 60 mg via INTRAVENOUS

## 2023-07-19 MED ORDER — PROPOFOL 10 MG/ML IV BOLUS
INTRAVENOUS | Status: AC
  Filled 2023-07-19: qty 20

## 2023-07-19 MED ORDER — BUPIVACAINE HCL (PF) 0.25 % IJ SOLN
INTRAMUSCULAR | Status: DC | PRN
  Administered 2023-07-19: 10 mL

## 2023-07-19 MED ORDER — FENTANYL CITRATE (PF) 100 MCG/2ML IJ SOLN: 25.0000 ug | INTRAMUSCULAR | Status: DC | PRN

## 2023-07-19 MED ORDER — LIDOCAINE HCL (PF) 2 % IJ SOLN
INTRAMUSCULAR | Status: AC
  Filled 2023-07-19: qty 5

## 2023-07-19 MED ORDER — ROCURONIUM BROMIDE 10 MG/ML (PF) SYRINGE
PREFILLED_SYRINGE | INTRAVENOUS | Status: AC
  Filled 2023-07-19: qty 10

## 2023-07-19 MED ORDER — OXYCODONE HCL 5 MG/5ML PO SOLN: 5.0000 mg | Freq: Once | ORAL | Status: DC | PRN

## 2023-07-19 MED ORDER — HYDROCODONE-ACETAMINOPHEN 5-325 MG PO TABS: 1.0000 | ORAL_TABLET | Freq: Four times a day (QID) | ORAL | 0 refills | Status: AC | PRN

## 2023-07-19 MED ORDER — LACTATED RINGERS IV SOLN: INTRAVENOUS | Status: DC

## 2023-07-19 MED ORDER — BUPIVACAINE HCL (PF) 0.25 % IJ SOLN
INTRAMUSCULAR | Status: AC
  Filled 2023-07-19: qty 30

## 2023-07-19 MED ORDER — DEXAMETHASONE SODIUM PHOSPHATE 10 MG/ML IJ SOLN
INTRAMUSCULAR | Status: DC | PRN
  Administered 2023-07-19: 10 mg via INTRAVENOUS

## 2023-07-19 MED ORDER — PROMETHAZINE HCL 25 MG/ML IJ SOLN: 6.2500 mg | INTRAMUSCULAR | Status: DC | PRN

## 2023-07-19 MED ORDER — ONDANSETRON HCL 4 MG/2ML IJ SOLN
INTRAMUSCULAR | Status: AC
  Filled 2023-07-19: qty 2

## 2023-07-19 MED ORDER — ACETAMINOPHEN 500 MG PO TABS
ORAL_TABLET | ORAL | Status: AC
  Filled 2023-07-19: qty 2

## 2023-07-19 MED ORDER — DEXMEDETOMIDINE HCL IN NACL 80 MCG/20ML IV SOLN
INTRAVENOUS | Status: AC
  Filled 2023-07-19: qty 20

## 2023-07-19 MED ORDER — SODIUM CHLORIDE 0.9 % IR SOLN
Status: DC | PRN
  Administered 2023-07-19: 500 mL

## 2023-07-19 MED ORDER — CELECOXIB 200 MG PO CAPS
ORAL_CAPSULE | ORAL | Status: AC
  Filled 2023-07-19: qty 1

## 2023-07-19 MED ORDER — OXYCODONE HCL 5 MG PO TABS: 5.0000 mg | ORAL_TABLET | Freq: Once | ORAL | Status: DC | PRN

## 2023-07-19 MED ORDER — MIDAZOLAM HCL 5 MG/5ML IJ SOLN
INTRAMUSCULAR | Status: DC | PRN
  Administered 2023-07-19: 2 mg via INTRAVENOUS

## 2023-07-19 MED ORDER — ONDANSETRON HCL 4 MG/2ML IJ SOLN
INTRAMUSCULAR | Status: DC | PRN
  Administered 2023-07-19: 4 mg via INTRAVENOUS

## 2023-07-19 MED ORDER — CELECOXIB 200 MG PO CAPS
200.0000 mg | ORAL_CAPSULE | Freq: Once | ORAL | Status: AC
  Administered 2023-07-19: 200 mg via ORAL

## 2023-07-19 MED ORDER — ACETAMINOPHEN 500 MG PO TABS
1000.0000 mg | ORAL_TABLET | Freq: Once | ORAL | Status: AC
  Administered 2023-07-19: 1000 mg via ORAL

## 2023-07-19 MED ORDER — LIDOCAINE 2% (20 MG/ML) 5 ML SYRINGE
INTRAMUSCULAR | Status: DC | PRN
  Administered 2023-07-19: 60 mg via INTRAVENOUS

## 2023-07-19 MED ORDER — DEXAMETHASONE SODIUM PHOSPHATE 10 MG/ML IJ SOLN
INTRAMUSCULAR | Status: AC
  Filled 2023-07-19: qty 1

## 2023-07-19 MED ORDER — MIDAZOLAM HCL 2 MG/2ML IJ SOLN
INTRAMUSCULAR | Status: AC
  Filled 2023-07-19: qty 2

## 2023-07-19 MED ORDER — FENTANYL CITRATE (PF) 100 MCG/2ML IJ SOLN
INTRAMUSCULAR | Status: AC
  Filled 2023-07-19: qty 2

## 2023-07-19 MED ORDER — SUGAMMADEX SODIUM 200 MG/2ML IV SOLN
INTRAVENOUS | Status: DC | PRN
  Administered 2023-07-19: 200 mg via INTRAVENOUS

## 2023-07-19 SURGICAL SUPPLY — 35 items
ADH SKN CLS APL DERMABOND .7 (GAUZE/BANDAGES/DRESSINGS) ×1
CABLE HIGH FREQUENCY MONO STRZ (ELECTRODE) IMPLANT
CATH ROBINSON RED A/P 16FR (CATHETERS) ×1 IMPLANT
COVER MAYO STAND STRL (DRAPES) ×1 IMPLANT
DERMABOND ADVANCED .7 DNX12 (GAUZE/BANDAGES/DRESSINGS) ×1 IMPLANT
DRAPE SURG IRRIG POUCH 19X23 (DRAPES) ×1 IMPLANT
DURAPREP 26ML APPLICATOR (WOUND CARE) ×1 IMPLANT
GAUZE 4X4 16PLY ~~LOC~~+RFID DBL (SPONGE) IMPLANT
GLOVE BIOGEL PI IND STRL 8 (GLOVE) ×1 IMPLANT
GLOVE ORTHO TXT STRL SZ7.5 (GLOVE) ×1 IMPLANT
GOWN STRL REUS W/TWL XL LVL3 (GOWN DISPOSABLE) ×1 IMPLANT
IRRIG SUCT STRYKERFLOW 2 WTIP (MISCELLANEOUS)
IRRIGATION SUCT STRKRFLW 2 WTP (MISCELLANEOUS) IMPLANT
KIT PINK PAD W/HEAD ARE REST (MISCELLANEOUS) ×1
KIT PINK PAD W/HEAD ARM REST (MISCELLANEOUS) ×1 IMPLANT
KIT TURNOVER CYSTO (KITS) ×1 IMPLANT
NDL INSUFFLATION 14GA 120MM (NEEDLE) ×1 IMPLANT
NEEDLE INSUFFLATION 14GA 120MM (NEEDLE) ×1 IMPLANT
NS IRRIG 1000ML POUR BTL (IV SOLUTION) ×1 IMPLANT
PACK LAPAROSCOPY BASIN (CUSTOM PROCEDURE TRAY) ×1 IMPLANT
SET TUBE SMOKE EVAC HIGH FLOW (TUBING) ×1 IMPLANT
SHEARS HARMONIC ACE PLUS 36CM (ENDOMECHANICALS) IMPLANT
SLEEVE SCD COMPRESS KNEE MED (STOCKING) ×1 IMPLANT
SLEEVE Z-THREAD 5X100MM (TROCAR) IMPLANT
SOL ELECTROSURG ANTI STICK (MISCELLANEOUS) ×1
SOLUTION ELECTROSURG ANTI STCK (MISCELLANEOUS) IMPLANT
SUT VIC AB 4-0 PS2 18 (SUTURE) ×1 IMPLANT
SUT VICRYL 0 UR6 27IN ABS (SUTURE) IMPLANT
SYS BAG RETRIEVAL 10MM (BASKET)
SYSTEM BAG RETRIEVAL 10MM (BASKET) IMPLANT
TOWEL OR 17X24 6PK STRL BLUE (TOWEL DISPOSABLE) ×1 IMPLANT
TROCAR BALLN 12MMX100 BLUNT (TROCAR) IMPLANT
TROCAR Z-THREAD FIOS 11X100 BL (TROCAR) IMPLANT
TROCAR Z-THREAD FIOS 5X100MM (TROCAR) ×1 IMPLANT
WARMER LAPAROSCOPE (MISCELLANEOUS) ×1 IMPLANT

## 2023-07-19 NOTE — Op Note (Signed)
Preoperative diagnosis: Desires surgical sterility Postoperative diagnosis: Same Procedure: Laparoscopic bilateral tubal fulguration Surgeon: Lavina Hamman M.D. Anesthesia: Gen. Endotracheal tube Findings: She had a normal pelvis with normal uterus, normal tubes and ovaries, normal upper abdomen Estimated blood loss: Minimal Complications: None  Procedure in detail: The patient was taken to the operating room and placed in the dorsosupine position. General anesthesia was induced. Her left arm was tucked to her side and legs were placed in mobile stirrups. Abdomen was then prepped and draped in the usual sterile fashion, bladder drained with a red Robinson catheter, Hulka tenaculum applied to the cervix for uterine manipulation. Infraumbilical skin was then infiltrated with quarter percent Marcaine and a 1 cm horizontal incision was made. The Veress needle was inserted into the peritoneal cavity and placement confirmed by the water drop test an opening pressure of 4 mm of mercury. CO2 was insufflated to a pressure of 14 mm of mercury and the Veress needle was removed. A 10/11 disposable trocar was then introduced with direct visualization with the laparoscope. The operating scope was then inserted. Good visualization was achieved, inspection revealed the above-mentioned findings. Both fallopian tubes were easily identified and traced to their fimbriated ends. A 3 cm portion of the middle of each tube was fulgurated with bipolar cautery until the amp meter read 0 in all spots along a 3 cm segment. This was done on both sides with good fulguration of both tubes and good hemostasis. No complications were encountered. The laparoscope was removed. All gas was allowed to deflate from the abdomen and the trocar was removed. Fascia was approximated with one suture of 0 Vicryl. Skin incision was closed with interrupted subcuticular sutures of 4-0 Vicryl followed by Dermabond. The Hulka tenaculum was removed,  bleeding controlled with pressure and AgNO3. The patient was awakened in the operating room and taken to the recovery in stable condition after tolerating the procedure well. Counts were correct and she had PAS hose on throughout the procedure.

## 2023-07-19 NOTE — Discharge Instructions (Addendum)
  Post Anesthesia Home Care Instructions  Activity: Get plenty of rest for the remainder of the day. A responsible individual must stay with you for 24 hours following the procedure.  For the next 24 hours, DO NOT: -Drive a car -Advertising copywriter -Drink alcoholic beverages -Take any medication unless instructed by your physician -Make any legal decisions or sign important papers.  Meals: Start with liquid foods such as gelatin or soup. Progress to regular foods as tolerated. Avoid greasy, spicy, heavy foods. If nausea and/or vomiting occur, drink only clear liquids until the nausea and/or vomiting subsides. Call your physician if vomiting continues.  Special Instructions/Symptoms: Your throat may feel dry or sore from the anesthesia or the breathing tube placed in your throat during surgery. If this causes discomfort, gargle with warm salt water. The discomfort should disappear within 24 hours.  If you had a scopolamine patch placed behind your ear for the management of post- operative nausea and/or vomiting:  1. The medication in the patch is effective for 72 hours, after which it should be removed.  Wrap patch in a tissue and discard in the trash. Wash hands thoroughly with soap and water. 2. You may remove the patch earlier than 72 hours if you experience unpleasant side effects which may include dry mouth, dizziness or visual disturbances. 3. Avoid touching the patch. Wash your hands with soap and water after contact with the patch.     HOME CARE INSTRUCTIONS - LAPAROSCOPY  Wound Care: The bandaids or dressing which are placed over the skin openings may be removed the day after surgery. The incision should be kept clean and dry. The stitches do not need to be removed. Should the incision become sore, red, and swollen after the first week, check with your doctor.  Personal Hygiene: Shower the day after your procedure. Always wipe from front to back after elimination.   Activity: Do  not drive or operate any equipment today. The effects of the anesthesia are still present and drowsiness may result. Rest today, not necessarily flat bed rest, just take it easy. You may resume your normal activity in one to three days or as instructed by your physician.  Sexual Activity: You resume sexual activity as indicated by your physician_________. If your laparoscopy was for a sterilization ( tubes tied ), continue current method of birth control until after your next period or ask for specific instructions from your doctor.  Diet: Eat a light diet as desired this evening. You may resume a regular diet tomorrow.  Return to Work: Two to three days or as indicated by your doctor.  Expectations After Surgery: Your surgery will cause vaginal drainage or spotting which may continue for 2-3 days. Mild abdominal discomfort or tenderness is not unusual and some shoulder pain may also be noted which can be relieved by lying flat in pain.  Call Your Doctor If these Occur:  Persistent or heavy bleeding at incision site       Redness or swelling around incision       Elevation of temperature greater than 100 degrees F  You received 1,000 mg Tylenol, and Celebrex (a nonsteroidal medication) at approximately 6 a.m. Next available dose of either will be after 12 noon today

## 2023-07-19 NOTE — Interval H&P Note (Signed)
History and Physical Interval Note:  07/19/2023 7:14 AM  Kristen Patterson  has presented today for surgery, with the diagnosis of sterilization Z30.2.  The various methods of treatment have been discussed with the patient and family. After consideration of risks, benefits and other options for treatment, the patient has consented to  Procedure(s): LAPAROSCOPIC BILATERAL SALPINGECTOMY (Bilateral) as a surgical intervention.  The patient's history has been reviewed, patient examined, no change in status, stable for surgery.  I have reviewed the patient's chart and labs.  Questions were answered to the patient's satisfaction.     [name] Amica Harron

## 2023-07-19 NOTE — Anesthesia Procedure Notes (Signed)
Procedure Name: Intubation Date/Time: 07/19/2023 7:42 AM  Performed by: Briant Sites, CRNAPre-anesthesia Checklist: Patient identified, Emergency Drugs available, Suction available and Patient being monitored Patient Re-evaluated:Patient Re-evaluated prior to induction Oxygen Delivery Method: Circle system utilized Preoxygenation: Pre-oxygenation with 100% oxygen Induction Type: IV induction Ventilation: Mask ventilation without difficulty Laryngoscope Size: Mac and 3 Grade View: Grade I Tube type: Oral Tube size: 7.0 mm Number of attempts: 1 Airway Equipment and Method: Stylet Placement Confirmation: ETT inserted through vocal cords under direct vision, positive ETCO2 and breath sounds checked- equal and bilateral Secured at: 21 cm Tube secured with: Tape Dental Injury: Teeth and Oropharynx as per pre-operative assessment

## 2023-07-19 NOTE — Anesthesia Postprocedure Evaluation (Signed)
Anesthesia Post Note  Patient: Kristen Patterson  Procedure(s) Performed: LAPAROSCOPIC BILATERAL TUBAL LIGATION (Bilateral: Abdomen)     Patient location during evaluation: PACU Anesthesia Type: General Level of consciousness: awake and alert Pain management: pain level controlled Vital Signs Assessment: post-procedure vital signs reviewed and stable Respiratory status: spontaneous breathing, nonlabored ventilation and respiratory function stable Cardiovascular status: stable and blood pressure returned to baseline Anesthetic complications: no   No notable events documented.  Last Vitals:  Vitals:   07/19/23 0930 07/19/23 0943  BP: 104/76 106/72  Pulse: (!) 55 67  Resp: 17 20  Temp:  (!) 36.4 C  SpO2: 97% 96%    Last Pain:  Vitals:   07/19/23 0558  TempSrc: Oral  PainSc: 0-No pain                 Beryle Lathe

## 2023-07-19 NOTE — Transfer of Care (Signed)
Immediate Anesthesia Transfer of Care Note  Patient: Kristen Patterson  Procedure(s) Performed: LAPAROSCOPIC BILATERAL TUBAL LIGATION (Bilateral: Abdomen)  Patient Location: PACU  Anesthesia Type:General  Level of Consciousness: drowsy  Airway & Oxygen Therapy: Patient Spontanous Breathing and Patient connected to nasal cannula oxygen  Post-op Assessment: Report given to RN  Post vital signs: Reviewed and stable  Last Vitals:  Vitals Value Taken Time  BP 111/69 07/19/23 0843  Temp    Pulse 65 07/19/23 0844  Resp 22 07/19/23 0844  SpO2 98 % 07/19/23 0844  Vitals shown include unfiled device data.  Last Pain:  Vitals:   07/19/23 0558  TempSrc: Oral  PainSc: 0-No pain         Complications: No notable events documented.

## 2023-07-20 ENCOUNTER — Encounter (HOSPITAL_BASED_OUTPATIENT_CLINIC_OR_DEPARTMENT_OTHER): Payer: Self-pay | Admitting: Obstetrics and Gynecology
